# Patient Record
Sex: Female | Born: 1990 | Race: White | Hispanic: No | Marital: Married | Smoking: Current some day smoker
Health system: Southern US, Community
[De-identification: ages and names within clinical notes are randomized; demographics above are authoritative.]

## PROBLEM LIST (undated history)

## (undated) DIAGNOSIS — I498 Other specified cardiac arrhythmias: Secondary | ICD-10-CM

## (undated) DIAGNOSIS — A609 Anogenital herpesviral infection, unspecified: Secondary | ICD-10-CM

## (undated) DIAGNOSIS — I951 Orthostatic hypotension: Secondary | ICD-10-CM

## (undated) DIAGNOSIS — F419 Anxiety disorder, unspecified: Secondary | ICD-10-CM

## (undated) DIAGNOSIS — R000 Tachycardia, unspecified: Secondary | ICD-10-CM

## (undated) HISTORY — PX: RHINOPLASTY: SUR1284

## (undated) HISTORY — PX: BREAST ENHANCEMENT SURGERY: SHX7

## (undated) HISTORY — DX: Anogenital herpesviral infection, unspecified: A60.9

## (undated) HISTORY — PX: COSMETIC SURGERY: SHX468

---
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
## 2009-01-01 ENCOUNTER — Inpatient Hospital Stay (HOSPITAL_COMMUNITY): Admission: AD | Admit: 2009-01-01 | Discharge: 2009-01-01 | Payer: Self-pay | Admitting: Obstetrics & Gynecology

## 2009-04-25 ENCOUNTER — Emergency Department (HOSPITAL_COMMUNITY): Admission: EM | Admit: 2009-04-25 | Discharge: 2009-04-25 | Payer: Self-pay | Admitting: Emergency Medicine

## 2009-07-21 ENCOUNTER — Emergency Department (HOSPITAL_COMMUNITY): Admission: EM | Admit: 2009-07-21 | Discharge: 2009-07-21 | Payer: Self-pay | Admitting: Emergency Medicine

## 2009-09-26 ENCOUNTER — Ambulatory Visit: Payer: Self-pay | Admitting: Family Medicine

## 2009-12-08 ENCOUNTER — Inpatient Hospital Stay (HOSPITAL_COMMUNITY): Admission: AD | Admit: 2009-12-08 | Discharge: 2009-12-08 | Payer: Self-pay | Admitting: Obstetrics and Gynecology

## 2010-08-26 ENCOUNTER — Ambulatory Visit
Admission: RE | Admit: 2010-08-26 | Discharge: 2010-08-26 | Payer: Self-pay | Source: Home / Self Care | Attending: Internal Medicine | Admitting: Internal Medicine

## 2010-08-26 ENCOUNTER — Encounter: Payer: Self-pay | Admitting: Internal Medicine

## 2010-09-03 NOTE — Assessment & Plan Note (Signed)
Summary: f3y/ POTS-MB   Visit Type:  Follow-up Primary Provider:  Dr. Benedetto Goad, [facility]  CC:  fainting.  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  She is a 20 year old young lady who has had a drop out of school because of symptoms related to autonomic dysfunction. She is scheduled to enroll at [employer] and a fall.  She was seen 3 years ago with atypical story of POTS manifested by long-standing orthostatic and post exercise intolerance. she also found showers. And heat difficult. Salt and fluids were recommended. This helped for some period of time. More recently she has had more syncope. The first episode occurred after having spent 15 minutes in a Jacuzzi; another occurred after a couple of alcoholic beverages and more recently spell occurred sitting on the couch. She finds it showers persistently difficult more often in the morning and afternoon. Her diet she thinks is quite salt replete and her fluid intake through the day is quite generous although she wakes up with a very dark colored urine in the morning. She does not notice significant symptoms around the time of her period; she is on birth control with monthly periods.  She continues to note that postexercise lightheadedness is a problem. Walking in the mall to the problem. For her variable lighting situations can be provocative.  Because of post exercise lightheadedness and syncope she is given of exercise.  she denies use of recreational drugs. She does use alcohol occasionally.  She also notices that this has been very discombobulating to life with a secondary depression not surprisingly given the impact on her life. She is seeking counseling support for this.        Preventive Screening-Counseling & Management  Alcohol-Tobacco     Smoking Status: never  Caffeine-Diet-Exercise     Does Patient Exercise: no  Current  Medications (verified): 1)  Yaz 3-0.02 Mg Tabs (Drospirenone-Ethinyl Estradiol) .... Daily 2)  Alprazolam 1 Mg Tabs (Alprazolam) .Marland Kitchen.. 1 Tablet Twice A Day  Allergies (verified): 1)  ! Celexa  Past History:  Past Medical History: dysautonomia Anxiety Dysmenorrhea  Past Surgical History: none  Family History: Father:dysautonomia Mother:  Social History: Student  Full Time Single  Tobacco Use - No.  Alcohol Use - yes Regular Exercise - no Smoking Status:  never Does Patient Exercise:  no  Review of Systems       full review of systems was negative apart from a history of present illness and past medical history.   Vital Signs:  Patient profile:   20 year old female Weight:      105.75 pounds Pulse (ortho):   139 / minute BP standing:   130 / 90  Vitals Entered By: Micki Riley CNA (August 26, 2010 4:26 PM)  Serial Vital Signs/Assessments:  Time      Position  BP       Pulse  Resp  Temp     By 0 min     Lying LA  108/71   87                    Terin Shoffner CNA 0 min     Sitting   117/78   108                   Terin Shoffner CNA 0 min     Standing  130/90   139  Terin Shoffner CNA 2 min     Standing  118/88   124                   Terin Shoffner CNA 3 min     Standing  120/92   100                   Terin Shoffner CNA  Comments: 0 min pt  having  dizziness throughout bp  By: Micki Riley CNA  2 min dizziness By: Micki Riley CNA  3 min dizziness By: Micki Riley CNA    Physical Exam  General:  Alert and oriented Young Caucasian female appearing her stated age in no acute distress. HEENT  normal . Neck veins were flat; carotids brisk and full without bruits. No lymphadenopathy. Back without kyphosis. Lungs clear. Heart sounds regular without murmurs or gallops. PMI nondisplaced. Abdomen soft with active bowel sounds without midline pulsation or hepatomegaly. Femoral pulses and distal pulses intact. Extremities were without  clubbing cyanosis or edemaSkin warm and dry with multiple tatoos. Neurological exam grossly normal; affect somewhat anxious    EKG  Procedure date:  08/26/2010  Findings:      sinus rhythm at 85 Intervals 0.12 5.08/0.37 Axis is 91 Otherwise normal ECG  Impression & Recommendations:  Problem # 1:  DYSAUTONOMIA/POTS (ICD-742.8) Marissa Mitchell has dysautonomia manifesting primarily as pots. We spent a long time discussing the physiology and the interaction between stress and anxiety. She has undertaken salt repletion in the past. She is encouraged to continue her salt intake. She is further encouraged to increase her fluid intake in the form of salt-containing beverages .  we discussed situations which are particularly troublesome which are associated with vasodilatation and how to use isometrics to fend off symptoms.  We'll begin her on low-dose beta blockade. Will start her atenolol. She is advised of possible side effects. She will call in the event that there is a problem.  In addition she is going to undertake work with counseling on her anxiety. I applaud the maturity of this decision.  Further we have given her the website for POTS place an Ndrf which potentially will have clear resources that might be of advantage to her. We will also try and identify local people who might be glad to speak with her about that experience with POTS  The thing that I suggested that she tend to his symptom cycle around her period. There may be some for period  obliterating contraceptive therapy in the event that her symptoms do cycle with her periods  when it comes time to her reticulated to [facility], she will need notes from [place] for housing and other accommodations.  Problem # 2:  ANXIETY (ICD-300.00) as above  Patient Instructions: 1)  Your physician recommends that you schedule a follow-up appointment in: 10-12 weeks with dr Graciela Husbands 2)  Your physician has recommended you make the following change in your  medication: atenolol 25 mg 1 once daily  3)  You have been diagnosed with POTS or postural orthostatic tachycardia syndrome. POTS is defined by fast heart rate when going from a sitting or lying position to an upright posture. The fast heart rates usually occurs within ten minutes of rising. Please see the handout/brochure given to you today for more information. Prescriptions: ATENOLOL 25 MG TABS (ATENOLOL) 1 once daily  #30 x 6   Entered by:   Scherrie Bateman, LPN   Authorized by:   Nathen May,  MD, [facility]   Signed by:   Scherrie Bateman, LPN on 94/85/4627   Method used:   Electronically to        [id number] (retail)       [address]       [place], [place]  [address]       Ph: [phone]       Fax: [phone]   RxID:   [id number]

## 2010-10-13 LAB — GC/CHLAMYDIA PROBE AMP, GENITAL: GC Probe Amp, Genital: NEGATIVE

## 2010-10-13 LAB — URINALYSIS, ROUTINE W REFLEX MICROSCOPIC
Nitrite: NEGATIVE
Specific Gravity, Urine: <1.005 — ABNORMAL LOW (ref 1.005–1.030)
Urobilinogen, UA: 0.2 mg/dL (ref 0.0–1.0)
pH: 5.5 (ref 5.0–8.0)

## 2010-10-13 LAB — URINE MICROSCOPIC-ADD ON

## 2010-10-13 LAB — WET PREP, GENITAL
Clue Cells Wet Prep HPF POC: NONE SEEN
Trich, Wet Prep: NONE SEEN

## 2010-10-31 LAB — DIFFERENTIAL
Basophils Absolute: 0 10*3/uL (ref 0.0–0.1)
Basophils Relative: 0 % (ref 0–1)
Neutro Abs: 7.7 10*3/uL (ref 1.7–7.7)
Neutrophils Relative %: 67 % (ref 43–77)

## 2010-10-31 LAB — COMPREHENSIVE METABOLIC PANEL
Alkaline Phosphatase: 68 U/L (ref 39–117)
BUN: 8 mg/dL (ref 6–23)
Chloride: 103 mEq/L (ref 96–112)
Creatinine, Ser: 0.73 mg/dL (ref 0.4–1.2)
Glucose, Bld: 82 mg/dL (ref 70–99)
Potassium: 3.5 mEq/L (ref 3.5–5.1)
Total Bilirubin: 1 mg/dL (ref 0.3–1.2)

## 2010-10-31 LAB — URINALYSIS, ROUTINE W REFLEX MICROSCOPIC
Hgb urine dipstick: NEGATIVE
Nitrite: NEGATIVE
Protein, ur: NEGATIVE mg/dL
Specific Gravity, Urine: 1.034 — ABNORMAL HIGH (ref 1.005–1.030)
Urobilinogen, UA: 0.2 mg/dL (ref 0.0–1.0)

## 2010-10-31 LAB — URINE MICROSCOPIC-ADD ON

## 2010-10-31 LAB — CBC
HCT: 42.5 % (ref 36.0–46.0)
Hemoglobin: 14.3 g/dL (ref 12.0–15.0)
MCV: 94.6 fL (ref 78.0–100.0)
RDW: 14.5 % (ref 11.5–15.5)
WBC: 11.5 10*3/uL — ABNORMAL HIGH (ref 4.0–10.5)

## 2010-10-31 LAB — POCT CARDIAC MARKERS
CKMB, poc: <1 ng/mL — ABNORMAL LOW (ref 1.0–8.0)
Myoglobin, poc: 45.7 ng/mL (ref 12–200)

## 2010-10-31 LAB — POCT PREGNANCY, URINE: Preg Test, Ur: NEGATIVE

## 2010-11-03 LAB — URINALYSIS, ROUTINE W REFLEX MICROSCOPIC
Bilirubin Urine: NEGATIVE
Glucose, UA: NEGATIVE mg/dL
Hgb urine dipstick: NEGATIVE
Ketones, ur: NEGATIVE mg/dL
Protein, ur: NEGATIVE mg/dL

## 2010-11-03 LAB — HCG, SERUM, QUALITATIVE: Preg, Serum: NEGATIVE

## 2010-11-09 ENCOUNTER — Inpatient Hospital Stay (INDEPENDENT_AMBULATORY_CARE_PROVIDER_SITE_OTHER)
Admission: RE | Admit: 2010-11-09 | Discharge: 2010-11-09 | Disposition: A | Discharge status: Home / Self Care | Payer: Self-pay | Source: Ambulatory Visit | Attending: Family Medicine | Admitting: Family Medicine

## 2010-11-09 DIAGNOSIS — N390 Urinary tract infection, site not specified: Secondary | ICD-10-CM

## 2010-11-09 LAB — POCT URINALYSIS DIP (DEVICE)
Ketones, ur: NEGATIVE mg/dL
Protein, ur: 30 mg/dL — AB
Specific Gravity, Urine: 1.015 (ref 1.005–1.030)
pH: 6.5 (ref 5.0–8.0)

## 2010-11-09 LAB — POCT PREGNANCY, URINE: Preg Test, Ur: NEGATIVE

## 2010-11-09 LAB — WET PREP, GENITAL: Clue Cells Wet Prep HPF POC: NONE SEEN

## 2010-11-11 LAB — URINE CULTURE

## 2010-12-09 NOTE — Letter (Signed)
May 04, 2007    Marissa Beath, MD  [address]  [place], [place] [address]   RE:  Marissa, Mitchell  MRN:  [id number]  /  DOB:  Nov 25, 1990   Dear Marissa Mitchell:   I was a pleasure talking to you today about Marissa Mitchell, whose last  name I misspoke, which is why you were confused.   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  These were characterized by lightheadedness, flushing  diaphoresis  and some nausea.  Remotely, she recalls a comment being  made about extreme pallor.   Of late, she has had two syncope episodes.  The first occurred while she  was showering at the dormitory at school.  She was having a little bit  of a stomach discomfort.  She had her stereotypical prodrome.  She sat  down in the shower.  Symptoms abated.  She tried to get out of the  shower.  She had significant residual orthostatic intolerance.  She  became quite diaphoretic.  She got back to her bedroom with the side  effect that she could hardly see, laid down for 5-10 minutes and  symptoms abated.  The fatigue lasted about an hour.   The second episode also occurred at school.  She was using the commode  for constipation.  She developed her stereotypical prodrome.  At this  point, she was unable to make it back to her room.  She actually  collapsed on the floor of the hallway.  Her roommate heard her, helped  her back in.  There was no comments at that point as to her skin color.   As noted, she has had these spells before.  They have been typically  precipitated by being in hot rooms with many people, prolonged standing,  Jacuzzis and showers.  She also has a history of tachy palpitations  accompanying these episodes.  She notes that these palpitations  frequently occur with just changes in position.  These are described not  as abrupt in onset or offset but gradually and accompanying the  aforementioned episodes.   Her diet is quite fluid repleted but quit salt deplete.  Her periods are  controlled by her birth control pills.   Because of these episodes and the possible diagnosis of panic attack, it  was elected to put her on Fluoxetine at 10 mg.  This has not been  associated with any significant benefit.   PAST MEDICAL HISTORY:  Largely as above.  She does have some  constipation and diarrhea, fluctuates as she has irritable bowel.  There  has been a remote history of anxiety and depression, currently, fairly  quiescent.   Other than the strong family history of mitral valve prolapse, it is  not clear what this represents in any of them.   She does not use cigarettes, alcohol or recreational drugs.  She is  quite fit, and she does have significant post exercise lightheadedness.  I should note also she was a Biochemist, clinical in high school, again without  symptoms during the exercises, but the resting phase quite symptomatic.   PHYSICAL EXAMINATION:  She is a young Caucasian female appearing her  stated age of 76.  Her blood pressure was 112/72 without significant  change.  Her heart rate went from 80-96 with prolonged standing.  HEENT:  No icterus or xanthelasma.  NECK:  Veins were flat.  CAROTIDS:  Brisk and full without bruits.  BACK:  Without kyphosis, scoliosis.  LUNGS:  Clear.  HEART:  Sounds regular without murmurs or gallops.  ABDOMEN:  Soft with active bowel sounds without mass on pulsation or  hepatosplenomegaly.  EXTREMITIES:  Femoral pulses were 2+.  Distal pulses were intact.  There  was no cyanosis, clubbing or edema.  NEUROLOGICAL:  Grossly normal.  SKIN:  Warm and dry.   STUDIES:  Electrocardiogram dated today demonstrated sinus rhythm at 81  with intervals of 0.12, 0.08, 0.3.  The axis was rightward at 90  degrees.  It was otherwise normal.   IMPRESSION:  1. Syncope, likely neurocardiogenic.  2. Tachy palpitations and other symptoms consistent  with possible      orthostatic tachycardia syndrome.  3. Remote history of anxiety/depression.  4. Salt depleted diet.   Marissa Mitchell probably has a dysautonomia.  I gave her a booklet on  possible orthostatic tachycardia syndrome which includes a couple of  websites including NDRF.org and potsplace.com as an information source.   From a therapeutic interventional point of view, I reminded her that  salt intake was going to be key.  If she does not like to add salt, then  she should go ahead and get salt tablets.  She is also encouraged to be  very cognizant of her prodrome and to make herself supine when this  happens so as to prevent a fall.   I did not explore with her the dimensions of her anxiety and depression,  but clearly, this can aggravate the propensity towards neurally mediated  syndromes, and this will need to be kept in mind.   She asked if we would spend a little more time, so I scheduled this for  about three months.   If I could be of any further assistance Marissa Mitchell, please do not hesitate  to contact me.   Thanks very much for allowing me to participate in her care.    Sincerely,      Duke Salvia, MD, [facility]  Electronically Signed    SCK/MedQ  DD: 05/04/2007  DT: 05/05/2007  Job #: [id number]

## 2011-04-06 ENCOUNTER — Other Ambulatory Visit: Payer: Self-pay | Admitting: *Deleted

## 2011-04-06 MED ORDER — ATENOLOL 25 MG PO TABS: 25.0000 mg | ORAL_TABLET | Freq: Every day | ORAL | Status: DC

## 2011-04-21 LAB — BASIC METABOLIC PANEL
BUN: 4 — ABNORMAL LOW
Calcium: 9.1
GFR calc non Af Amer: >60
Glucose, Bld: 102 — ABNORMAL HIGH
Potassium: 3.3 — ABNORMAL LOW
Sodium: 139

## 2011-04-21 LAB — CBC
Platelets: 308
RBC: 4.06
WBC: 8.8

## 2011-04-21 LAB — RAPID URINE DRUG SCREEN, HOSP PERFORMED
Amphetamines: NOT DETECTED
Barbiturates: NOT DETECTED
Benzodiazepines: POSITIVE — AB
Cocaine: NOT DETECTED
Opiates: NOT DETECTED
Tetrahydrocannabinol: NOT DETECTED

## 2011-04-21 LAB — URINALYSIS, ROUTINE W REFLEX MICROSCOPIC
Bilirubin Urine: NEGATIVE
Ketones, ur: NEGATIVE
Nitrite: NEGATIVE
Urobilinogen, UA: 0.2
pH: 7

## 2011-04-21 LAB — DIFFERENTIAL
Lymphocytes Relative: 36
Lymphs Abs: 3.2
Monocytes Relative: 8
Neutro Abs: 4.7
Neutrophils Relative %: 54

## 2011-04-21 LAB — POCT I-STAT, CHEM 8
Creatinine, Ser: 1
Glucose, Bld: 87
HCT: 37
Hemoglobin: 12.6
Potassium: 3.6
Sodium: 139
TCO2: 26

## 2011-04-21 LAB — POCT CARDIAC MARKERS
CKMB, poc: <1 — ABNORMAL LOW
Myoglobin, poc: 91.4
Operator id: 294511

## 2011-04-21 LAB — PREGNANCY, URINE: Preg Test, Ur: NEGATIVE

## 2011-09-06 ENCOUNTER — Emergency Department: Payer: Self-pay | Admitting: Emergency Medicine

## 2011-09-06 LAB — CBC WITH DIFFERENTIAL/PLATELET
Basophil #: 0 10*3/uL (ref 0.0–0.1)
Basophil %: 0.5 %
Eosinophil #: 0.1 10*3/uL (ref 0.0–0.7)
Eosinophil %: 1.5 %
HCT: 36.5 % (ref 35.0–47.0)
HGB: 12.5 g/dL (ref 12.0–16.0)
Lymphocyte #: 2.3 10*3/uL (ref 1.0–3.6)
Lymphocyte %: 28 %
MCH: 31.6 pg (ref 26.0–34.0)
MCHC: 34.1 g/dL (ref 32.0–36.0)
MCV: 93 fL (ref 80–100)
Monocyte #: 0.7 10*3/uL (ref 0.0–0.7)
Monocyte %: 8.6 %
Neutrophil #: 5.1 10*3/uL (ref 1.4–6.5)
Neutrophil %: 61.4 %
Platelet: 207 10*3/uL (ref 150–440)
RBC: 3.95 10*6/uL (ref 3.80–5.20)
RDW: 13.1 % (ref 11.5–14.5)
WBC: 8.3 10*3/uL (ref 3.6–11.0)

## 2011-09-06 LAB — PREGNANCY, URINE: Pregnancy Test, Urine: NEGATIVE m[IU]/mL

## 2011-11-19 ENCOUNTER — Emergency Department: Payer: Self-pay | Admitting: Emergency Medicine

## 2011-11-19 LAB — URINALYSIS, COMPLETE
Blood: NEGATIVE
Glucose,UR: NEGATIVE mg/dL (ref 0–75)
Ketone: NEGATIVE
Leukocyte Esterase: NEGATIVE
Specific Gravity: 1.005 (ref 1.003–1.030)
Squamous Epithelial: 5

## 2011-11-19 LAB — CBC
HGB: 12.4 g/dL (ref 12.0–16.0)
MCH: 30.9 pg (ref 26.0–34.0)
MCHC: 33.5 g/dL (ref 32.0–36.0)
MCV: 92 fL (ref 80–100)
RDW: 13.6 % (ref 11.5–14.5)
WBC: 15.3 10*3/uL — ABNORMAL HIGH (ref 3.6–11.0)

## 2011-11-19 LAB — COMPREHENSIVE METABOLIC PANEL
Albumin: 3.8 g/dL (ref 3.4–5.0)
Alkaline Phosphatase: 62 U/L (ref 50–136)
BUN: 6 mg/dL — ABNORMAL LOW (ref 7–18)
Bilirubin,Total: 0.4 mg/dL (ref 0.2–1.0)
Chloride: 104 mmol/L (ref 98–107)
Creatinine: 0.57 mg/dL — ABNORMAL LOW (ref 0.60–1.30)
EGFR (African American): >60
EGFR (Non-African Amer.): >60
Osmolality: 270 (ref 275–301)
Potassium: 3.5 mmol/L (ref 3.5–5.1)
Sodium: 136 mmol/L (ref 136–145)

## 2011-11-19 LAB — MONONUCLEOSIS SCREEN: Mono Test: NEGATIVE

## 2011-11-25 LAB — CULTURE, BLOOD (SINGLE)

## 2012-11-10 ENCOUNTER — Emergency Department: Payer: Self-pay | Admitting: Emergency Medicine

## 2012-11-12 ENCOUNTER — Emergency Department (HOSPITAL_COMMUNITY)
Admission: EM | Admit: 2012-11-12 | Discharge: 2012-11-12 | Disposition: A | Discharge status: Home / Self Care | Payer: Self-pay | Attending: Emergency Medicine | Admitting: Emergency Medicine

## 2012-11-12 ENCOUNTER — Encounter (HOSPITAL_COMMUNITY): Payer: Self-pay | Admitting: Emergency Medicine

## 2012-11-12 DIAGNOSIS — H53149 Visual discomfort, unspecified: Secondary | ICD-10-CM | POA: Insufficient documentation

## 2012-11-12 DIAGNOSIS — R51 Headache: Secondary | ICD-10-CM | POA: Insufficient documentation

## 2012-11-12 DIAGNOSIS — Z79899 Other long term (current) drug therapy: Secondary | ICD-10-CM | POA: Insufficient documentation

## 2012-11-12 DIAGNOSIS — H538 Other visual disturbances: Secondary | ICD-10-CM | POA: Insufficient documentation

## 2012-11-12 DIAGNOSIS — F17200 Nicotine dependence, unspecified, uncomplicated: Secondary | ICD-10-CM | POA: Insufficient documentation

## 2012-11-12 DIAGNOSIS — H109 Unspecified conjunctivitis: Secondary | ICD-10-CM | POA: Insufficient documentation

## 2012-11-12 MED ORDER — FLUORESCEIN SODIUM 1 MG OP STRP
1.0000 | ORAL_STRIP | Freq: Once | OPHTHALMIC | Status: DC
  Filled 2012-11-12: qty 1

## 2012-11-12 MED ORDER — TETRACAINE HCL 0.5 % OP SOLN
1.0000 [drp] | Freq: Once | OPHTHALMIC | Status: DC
  Filled 2012-11-12: qty 2

## 2012-11-12 NOTE — ED Notes (Signed)
Diagnosed with pink eye (left eye) on Wednesday at [facility].  Using eye drops 4 times per day.  Reports blurred vision to L eye since 11pm.

## 2012-11-12 NOTE — ED Provider Notes (Signed)
History     CSN: [id number]  Arrival date & time 11/12/12  4098   First MD Initiated Contact with Patient 11/12/12 [phone]      Chief Complaint  Patient presents with  . Conjunctivitis    (Consider location/radiation/quality/duration/timing/severity/associated sxs/prior treatment) HPI  Patient is a 22 yo F recently diagnosed at [facility] ED with bilateral bacterial conjunctivitis now presenting w/ L eye blurred vision that began this morning at midnight after antibiotic drop use. Patient states both eyes have been painful since she was diagnosed with conjunctivitis, but left eye is worse with eye drop use. States it feels like sharp glass in her eyes. Patient uses glasses for vision, but does not always wear them. Does not wear contact lenses. Denies any new trauma to eyes since work up at [facility] ED. Denies fevers, chills, headache, orbital erythema, orbital swelling, vision loss, or other visual complaints.    History reviewed. No pertinent past medical history.  History reviewed. No pertinent past surgical history.  No family history on file.  History  Substance Use Topics  . Smoking status: Current Every Day Smoker  . Smokeless tobacco: Not on file  . Alcohol Use: Yes    OB History   Grav Para Term Preterm Abortions TAB SAB Ect Mult Living                  Review of Systems  Constitutional: Negative.   Eyes: Positive for photophobia, pain, discharge, redness, itching and visual disturbance.  Respiratory: Negative.   Cardiovascular: Negative.   Gastrointestinal: Negative.   Genitourinary: Negative.   Musculoskeletal: Negative.   Neurological: Positive for headaches.    Allergies  Citalopram hydrobromide and Percocet  Home Medications   Current Outpatient Rx  Name  Route  Sig  Dispense  Refill  . ALPRAZolam (XANAX) 1 MG tablet   Oral   Take 1 mg by mouth 3 (three) times daily as needed for anxiety.         Marland Kitchen atenolol (TENORMIN) 25 MG tablet   Oral  Take 1 tablet (25 mg total) by mouth daily.   30 tablet   3   . ibuprofen (ADVIL,MOTRIN) 200 MG tablet   Oral   Take 600 mg by mouth every 6 (six) hours as needed for pain.         Marland Kitchen trimethoprim-polymyxin b (POLYTRIM) ophthalmic solution   Both Eyes   Place 2 drops into both eyes every 6 (six) hours.           BP 131/85  Pulse 84  Temp(Src) 98.2 F (36.8 C) (Oral)  Resp 18  SpO2 100%  Physical Exam  Constitutional: She is oriented to person, place, and time. She appears well-developed and well-nourished.  HENT:  Head: Normocephalic and atraumatic.  Eyes: EOM are normal. Pupils are equal, round, and reactive to light. No foreign bodies found. Right eye exhibits discharge. Right eye exhibits no exudate and no hordeolum. No foreign body present in the right eye. Left eye exhibits discharge. Left eye exhibits no exudate and no hordeolum. No foreign body present in the left eye. Right conjunctiva is injected. Left conjunctiva is injected.  No periorbital swelling bilaterally  Neck: Normal range of motion. Neck supple.  Cardiovascular: Normal rate, regular rhythm and normal heart sounds.   Pulmonary/Chest: Effort normal and breath sounds normal.  Abdominal: Soft. Bowel sounds are normal.  Neurological: She is alert and oriented to person, place, and time.  Skin: Skin is warm and dry.  Psychiatric: She has a normal mood and affect.     ED Course  Procedures (including critical care time)  OD: 20/40 OS: 20/50   Fluorescein stain performed left eye - no signs of corneal abrasions or ulcers noted.    Labs Reviewed - No data to display No results found.   1. Conjunctivitis       MDM  Patient presentation consistent with bacterial conjunctivitis.  With minimal purulent discharge. No corneal abrasions, entrapment, consensual photophobia, or dendritic staining with fluorescein study.  Presentation non-concerning for iritis, bacterial conjunctivitis, corneal abrasions, or  HSV.  No antibiotics are indicated and patient will be prescribed naphazoline for itching.  Personal hygiene and frequent handwashing discussed.  Patient advised to followup with ophthalmologist if symptoms persist or worsen in any way including vision change or purulent discharge.  Patient verbalizes understanding and is agreeable with discharge. Patient d/w with Dr. Oletta Lamas, agrees with plan. Patient is stable at time of discharge.           Jeannetta Ellis, PA-C 11/12/12 1502

## 2012-11-12 NOTE — ED Notes (Signed)
Left eye- 20/50 right eye- 20/40. Pt states she has glasses but does not wear them.

## 2012-11-13 NOTE — ED Provider Notes (Signed)
Medical screening examination/treatment/procedure(s) were performed by non-physician practitioner and as supervising physician I was immediately available for consultation/collaboration.   Gaelle Adriance Y. Alano Blasco, MD 11/13/12 0028 

## 2013-09-21 ENCOUNTER — Encounter (HOSPITAL_COMMUNITY): Payer: Self-pay | Admitting: Emergency Medicine

## 2013-09-21 ENCOUNTER — Emergency Department (HOSPITAL_COMMUNITY)
Admission: EM | Admit: 2013-09-21 | Discharge: 2013-09-21 | Disposition: A | Discharge status: Home / Self Care | Payer: Self-pay | Attending: Emergency Medicine | Admitting: Emergency Medicine

## 2013-09-21 ENCOUNTER — Emergency Department (HOSPITAL_COMMUNITY): Payer: Self-pay

## 2013-09-21 DIAGNOSIS — S61209A Unspecified open wound of unspecified finger without damage to nail, initial encounter: Secondary | ICD-10-CM | POA: Insufficient documentation

## 2013-09-21 DIAGNOSIS — F17200 Nicotine dependence, unspecified, uncomplicated: Secondary | ICD-10-CM | POA: Insufficient documentation

## 2013-09-21 DIAGNOSIS — Z79899 Other long term (current) drug therapy: Secondary | ICD-10-CM | POA: Insufficient documentation

## 2013-09-21 DIAGNOSIS — Y929 Unspecified place or not applicable: Secondary | ICD-10-CM | POA: Insufficient documentation

## 2013-09-21 DIAGNOSIS — Y939 Activity, unspecified: Secondary | ICD-10-CM | POA: Insufficient documentation

## 2013-09-21 DIAGNOSIS — W540XXA Bitten by dog, initial encounter: Secondary | ICD-10-CM | POA: Insufficient documentation

## 2013-09-21 HISTORY — DX: Tachycardia, unspecified: R00.0

## 2013-09-21 HISTORY — DX: Other specified cardiac arrhythmias: I49.8

## 2013-09-21 HISTORY — DX: Orthostatic hypotension: I95.1

## 2013-09-21 MED ORDER — HYDROCODONE-ACETAMINOPHEN 5-325 MG PO TABS: 2.0000 | ORAL_TABLET | ORAL | Status: DC | PRN

## 2013-09-21 MED ORDER — AMOXICILLIN-POT CLAVULANATE 875-125 MG PO TABS
1.0000 | ORAL_TABLET | Freq: Once | ORAL | Status: AC
  Administered 2013-09-21: 1 via ORAL
  Filled 2013-09-21: qty 1

## 2013-09-21 MED ORDER — AMOXICILLIN-POT CLAVULANATE 875-125 MG PO TABS: 1.0000 | ORAL_TABLET | Freq: Two times a day (BID) | ORAL | Status: DC

## 2013-09-21 NOTE — ED Notes (Signed)
Pt st's she was bitten on left hand by neighbors dog.  Pt has sm puncture wound to left thumb.  [place] notified her that the dog is up to date on its shots.

## 2013-09-21 NOTE — Discharge Instructions (Signed)
Animal Bite °An animal bite can result in a scratch on the skin, deep open cut, puncture of the skin, crush injury, or tearing away of the skin or a body part. Dogs are responsible for most animal bites. Children are bitten more often than adults. An animal bite can range from very mild to more serious. A small bite from your house pet is no cause for alarm. However, some animal bites can become infected or injure a bone or other tissue. You must seek medical care if: °· The skin is broken and bleeding does not slow down or stop after 15 minutes. °· The puncture is deep and difficult to clean (such as a cat bite). °· Pain, warmth, redness, or pus develops around the wound. °· The bite is from a stray animal or rodent. There may be a risk of rabies infection. °· The bite is from a snake, raccoon, skunk, fox, coyote, or bat. There may be a risk of rabies infection. °· The person bitten has a chronic illness such as diabetes, liver disease, or cancer, or the person takes medicine that lowers the immune system. °· There is concern about the location and severity of the bite. °It is important to clean and protect an animal bite wound right away to prevent infection. Follow these steps: °· Clean the wound with plenty of water and soap. °· Apply an antibiotic cream. °· Apply gentle pressure over the wound with a clean towel or gauze to slow or stop bleeding. °· Elevate the affected area above the heart to help stop any bleeding. °· Seek medical care. Getting medical care within 8 hours of the animal bite leads to the best possible outcome. °DIAGNOSIS  °Your caregiver will most likely: °· Take a detailed history of the animal and the bite injury. °· Perform a wound exam. °· Take your medical history. °Blood tests or X-rays may be performed. Sometimes, infected bite wounds are cultured and sent to a lab to identify the infectious bacteria.  °TREATMENT  °Medical treatment will depend on the location and type of animal bite as  well as the patient's medical history. Treatment may include: °· Wound care, such as cleaning and flushing the wound with saline solution, bandaging, and elevating the affected area. °· Antibiotics. °· Tetanus immunization. °· Rabies immunization. °· Leaving the wound open to heal. This is often done with animal bites, due to the high risk of infection. However, in certain cases, wound closure with stitches, wound adhesive, skin adhesive strips, or staples may be used. ° Infected bites that are left untreated may require intravenous (IV) antibiotics and surgical treatment in the hospital. °HOME CARE INSTRUCTIONS °· Follow your caregiver's instructions for wound care. °· Take all medicines as directed. °· If your caregiver prescribes antibiotics, take them as directed. Finish them even if you start to feel better. °· Follow up with your caregiver for further exams or immunizations as directed. °You may need a tetanus shot if: °· You cannot remember when you had your last tetanus shot. °· You have never had a tetanus shot. °· The injury broke your skin. °If you get a tetanus shot, your arm may swell, get red, and feel warm to the touch. This is common and not a problem. If you need a tetanus shot and you choose not to have one, there is a rare chance of getting tetanus. Sickness from tetanus can be serious. °SEEK MEDICAL CARE IF: °· You notice warmth, redness, soreness, swelling, pus discharge, or a bad   smell coming from the wound.  You have a red line on the skin coming from the wound.  You have a fever, chills, or a general ill feeling.  You have nausea or vomiting.  You have continued or worsening pain.  You have trouble moving the injured part.  You have other questions or concerns. MAKE SURE YOU:  Understand these instructions.  Will watch your condition.  Will get help right away if you are not doing well or get worse. Document Released: 03/31/2011 Document Revised: 10/05/2011 Document  Reviewed: 03/31/2011 [facility] Patient Information 2014 [place], [place].  Puncture Wound A puncture wound is an injury that extends through all layers of the skin and into the tissue beneath the skin (subcutaneous tissue). Puncture wounds become infected easily because germs often enter the body and go beneath the skin during the injury. Having a deep wound with a small entrance point makes it difficult for your caregiver to adequately clean the wound. This is especially true if you have stepped on a nail and it has passed through a dirty shoe or other situations where the wound is obviously contaminated. CAUSES  Many puncture wounds involve glass, nails, splinters, fish hooks, or other objects that enter the skin (foreign bodies). A puncture wound may also be caused by a human bite or animal bite. DIAGNOSIS  A puncture wound is usually diagnosed by your history and a physical exam. You may need to have an X-ray or an ultrasound to check for any foreign bodies still in the wound. TREATMENT   Your caregiver will clean the wound as thoroughly as possible. Depending on the location of the wound, a bandage (dressing) may be applied.  Your caregiver might prescribe antibiotic medicines.  You may need a follow-up visit to check on your wound. Follow all instructions as directed by your caregiver. HOME CARE INSTRUCTIONS   Change your dressing once per day, or as directed by your caregiver. If the dressing sticks, it may be removed by soaking the area in water.  If your caregiver has given you follow-up instructions, it is very important that you return for a follow-up appointment. Not following up as directed could result in a chronic or permanent injury, pain, and disability.  Only take over-the-counter or prescription medicines for pain, discomfort, or fever as directed by your caregiver.  If you are given antibiotics, take them as directed. Finish them even if you start to feel better. You may  need a tetanus shot if:  You cannot remember when you had your last tetanus shot.  You have never had a tetanus shot. If you got a tetanus shot, your arm may swell, get red, and feel warm to the touch. This is common and not a problem. If you need a tetanus shot and you choose not to have one, there is a rare chance of getting tetanus. Sickness from tetanus can be serious. You may need a rabies shot if an animal bite caused your puncture wound. SEEK MEDICAL CARE IF:   You have redness, swelling, or increasing pain in the wound.  You have red streaks going away from the wound.  You notice a bad smell coming from the wound or dressing.  You have yellowish-white fluid (pus) coming from the wound.  You are treated with an antibiotic for infection, but the infection is not getting better.  You notice something in the wound, such as rubber from your shoe, cloth, or another object.  You have a fever.  You have  severe pain.  You have difficulty breathing.  You feel dizzy or faint.  You cannot stop vomiting.  You lose feeling, develop numbness, or cannot move a limb below the wound.  Your symptoms worsen. MAKE SURE YOU:  Understand these instructions.  Will watch your condition.  Will get help right away if you are not doing well or get worse. Document Released: 04/22/2005 Document Revised: 10/05/2011 Document Reviewed: 12/30/2010 [facility] Patient Information 2014 [place], [place].

## 2013-09-21 NOTE — ED Notes (Addendum)
Dressing with bacitracin applied to affected area (Lt Thumb)

## 2013-09-21 NOTE — ED Provider Notes (Signed)
CSN: [id number]     Arrival date & time 09/21/13  2009 History  This chart was scribed for non-physician practitioner Cherrie DistanceFrances Demarrio Menges, PA-C working with Shanna CiscoMegan E Docherty, MD by Danella Maiersaroline Early, ED Scribe. This patient was seen in room TR09C/TR09C and the patient's care was started at 8:20 PM.    Chief Complaint  Patient presents with  . Animal Bite   The history is provided by the patient. No language interpreter was used.   HPI Comments: Marissa HuntsmanHannah B Mitchell is a 23 y.o. female who presents to the Emergency Department complaining of a dog bite to the left hand by a neighbor's small dog that occurred tonight. Pt reports a small puncture wound to the left thumb. Pt states that according to the police the dog is up to date on its shots. Her last tetanus was 2-3 years ago.   Past Medical History  Diagnosis Date  . POTS (postural orthostatic tachycardia syndrome)    Past Surgical History  Procedure Laterality Date  . Breast enhancement surgery    . Cosmetic surgery     No family history on file. History  Substance Use Topics  . Smoking status: Current Every Day Smoker  . Smokeless tobacco: Not on file  . Alcohol Use: Yes   OB History   Grav Para Term Preterm Abortions TAB SAB Ect Mult Living                 Review of Systems  Skin: Positive for wound.  Neurological: Negative for numbness.  All other systems reviewed and are negative.      Allergies  Citalopram hydrobromide and Percocet  Home Medications   Current Outpatient Rx  Name  Route  Sig  Dispense  Refill  . ALPRAZolam (XANAX) 1 MG tablet   Oral   Take 1 mg by mouth 3 (three) times daily as needed for anxiety.         Marland Kitchen. atenolol (TENORMIN) 25 MG tablet   Oral   Take 1 tablet (25 mg total) by mouth daily.   30 tablet   3   . ibuprofen (ADVIL,MOTRIN) 200 MG tablet   Oral   Take 600 mg by mouth every 6 (six) hours as needed for pain.         Marland Kitchen. trimethoprim-polymyxin b (POLYTRIM) ophthalmic solution   Both  Eyes   Place 2 drops into both eyes every 6 (six) hours.          BP 111/62  Pulse 81  Temp(Src) 98.3 F (36.8 C) (Oral)  Resp 16  Ht 5\' 5"  (1.651 m)  Wt 107 lb (48.535 kg)  BMI 17.81 kg/m2  SpO2 97%  LMP 07/28/2013 Physical Exam  Nursing note and vitals reviewed. Constitutional: She is oriented to person, place, and time. She appears well-developed and well-nourished. No distress.  HENT:  Head: Normocephalic and atraumatic.  Mouth/Throat: Oropharynx is clear and moist.  Eyes: Conjunctivae are normal. No scleral icterus.  Pulmonary/Chest: Effort normal.  Musculoskeletal: Normal range of motion. She exhibits tenderness. She exhibits no edema.       Left hand: She exhibits tenderness. She exhibits normal range of motion, no bony tenderness, normal two-point discrimination, normal capillary refill and no deformity. Decreased sensation noted. Decreased sensation is present in the radial distribution. Decreased sensation is not present in the ulnar distribution and is not present in the medial redistribution. Normal strength noted. She exhibits no finger abduction, no thumb/finger opposition and no wrist extension trouble.  Hands: Neurological: She is alert and oriented to person, place, and time. She exhibits normal muscle tone. Coordination normal.  Skin: Skin is warm and dry. No rash noted. No erythema. No pallor.  Psychiatric: She has a normal mood and affect. Her behavior is normal. Judgment and thought content normal.    ED Course  Procedures (including critical care time) Medications - No data to display  DIAGNOSTIC STUDIES: Oxygen Saturation is 97% on RA, normal by my interpretation.    COORDINATION OF CARE: 8:25 PM- Discussed treatment plan with pt which includes antibiotics. Pt agrees to plan.    Labs Review Labs Reviewed - No data to display Imaging Review No results found.  EKG Interpretation   None       Dg Hand Complete Left  09/21/2013   CLINICAL  DATA:  Dog bite.  EXAM: LEFT HAND - COMPLETE 3+ VIEW  COMPARISON:  None.  FINDINGS: Imaged bones, joints and soft tissues appear normal.  IMPRESSION: Negative exam.   Electronically Signed   By: Drusilla Kanner M.D.   On: 09/21/2013 21:28     MDM   Dog bite to left thumb  Patient with dog bite to left thumb, no evidence of tendon damage, numbness noted proximal to injury but none distal, no erythema at this point and wound is not suturable.  Plan to place her on augmentin and have her return to an ED in [place] on Saturday.  She will follow up with Dr. [name] if needed next week.  I spoke with Dr. [name] on the phone in regards to this patient who recommends follow up in 2 days to ensure no ensuing infection, but does not appear to have a surgical injury that may require repair.  I personally performed the services described in this documentation, which was scribed in my presence. The recorded information has been reviewed and is accurate.    Marissa Mitchell Marisue Humble, [place] 09/21/13 [id number]

## 2013-09-22 NOTE — ED Provider Notes (Signed)
Medical screening examination/treatment/procedure(s) were performed by non-physician practitioner and as supervising physician I was immediately available for consultation/collaboration.   Kimara Bencomo E Jadan Hinojos, MD 09/22/13 1304 

## 2014-09-03 ENCOUNTER — Encounter: Payer: Self-pay | Admitting: Internal Medicine

## 2014-09-03 ENCOUNTER — Ambulatory Visit (INDEPENDENT_AMBULATORY_CARE_PROVIDER_SITE_OTHER): Payer: No Typology Code available for payment source | Admitting: Internal Medicine

## 2014-09-03 VITALS — BP 116/74 | HR 83 | Ht 64.0 in | Wt 111.4 lb

## 2014-09-03 DIAGNOSIS — R000 Tachycardia, unspecified: Secondary | ICD-10-CM

## 2014-09-03 DIAGNOSIS — G90A Postural orthostatic tachycardia syndrome (POTS): Secondary | ICD-10-CM

## 2014-09-03 DIAGNOSIS — I951 Orthostatic hypotension: Secondary | ICD-10-CM

## 2014-09-03 NOTE — Progress Notes (Signed)
ELECTROPHYSIOLOGY CONSULT NOTE  Patient ID: Marissa Mitchell, MRN: [id number], DOB/AGE: [birth year] 23 y.o. Admit date: (Not on file) Date of Consult: 09/03/2014  Primary Physician: Pamelia HoitWILSON,FRED HENRY, MD Primary Cardiologist: new Chief Complaint: POTS   HPI Marissa Mitchell is a 24 y.o. female  Whom I last saw in 2012 and last before then in 2009. She has a history of long-standing orthostatic intolerance and exercise intolerance. She's had heat intolerance and shower intolerance. She actually struggled with seen in school because of symptoms. 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 This continues to be some issue. She has been on Xanax for this for some time. Previously she was on SSRI which she didn't tolerate well.    It was her impression that atenolol , while initially prescribed was associated with an continue to be associated with fatigue, was markedly improving her symptoms of orthostatic intolerance. She has worked hard on   volume repletion , perhaps not as aggressive about salt repletion  Symptoms now include exercise intolerance shower intolerance orthostatic intolerance heat intolerance. Her symptoms have been worse on her birth control which allows. As opposed to those that were unassociated with periods.       Past Medical History  Diagnosis Date  . POTS (postural orthostatic tachycardia syndrome)       Surgical History:  Past Surgical History  Procedure Laterality Date  . Breast enhancement surgery    . Cosmetic surgery       Home Meds: Prior to Admission medications   Medication Sig Start Date End Date Taking? Authorizing Provider  ALPRAZolam Prudy Feeler(XANAX) 1 MG tablet Take 1 mg by mouth 3 (three) times daily as needed for anxiety.   Yes Historical Provider, MD  atenolol (TENORMIN) 25 MG tablet Take 25 mg by mouth daily.   Yes Historical Provider, MD  drospirenone-ethinyl estradiol (YAZ,GIANVI,LORYNA) 3-0.02 MG tablet Take 1 tablet  by mouth daily.   Yes Historical Provider, MD  promethazine (PHENERGAN) 25 MG tablet Take 25 mg by mouth every 6 (six) hours as needed for nausea or vomiting.   Yes Historical Provider, MD     Allergies:  Allergies  Allergen Reactions  . Citalopram Hydrobromide Hives  . Percocet [Oxycodone-Acetaminophen] Hives    History   Social History  . Marital Status: Single    Spouse Name: N/A    Number of Children: N/A  . Years of Education: N/A   Occupational History  . Not on file.   Social History Main Topics  . Smoking status: Current Every Day Smoker  . Smokeless tobacco: Not on file  . Alcohol Use: Yes  . Drug Use: No  . Sexual Activity: Not on file   Other Topics Concern  . Not on file   Social History Narrative     History reviewed. No pertinent family history.   ROS:  Please see the history of present illness.     All other systems reviewed and negative.    Physical Exam: Blood pressure 116/74, pulse 83, height 5\' 4"  (1.626 m), weight 111 lb 6.4 oz (50.531 kg). General: Well developed, well nourished female in no acute distress. Head: Normocephalic, atraumatic, sclera non-icteric, no xanthomas, nares are without discharge. EENT: normal Lymph Nodes:  none Back: without scoliosis/kyphosis, no CVA tendersness Neck: Negative for carotid bruits. JVD not elevated. Lungs: Clear bilaterally to auscultation without wheezes, rales, or rhonchi. Breathing is unlabored. Heart: RRR with S1 S2. No murmur , rubs, or gallops appreciated.  Abdomen: Soft, non-tender, non-distended with normoactive bowel sounds. No hepatomegaly. No rebound/guarding. No obvious abdominal masses. Msk:  Strength and tone appear normal for age. Extremities: No clubbing or cyanosis. No  edema.  Distal pedal pulses are 2+ and equal bilaterally. Skin: Warm and Dry; multiple tatoos Neuro: Alert and oriented X 3. CN III-XII intact Grossly normal sensory and motor function . Psych:  Responds to questions  appropriately with a normal affect.      Labs: Cardiac Enzymes No results for input(s): CKTOTAL, CKMB, TROPONINI in the last 72 hours. CBC Lab Results  Component Value Date   WBC 11.5* 04/25/2009   HGB 14.3 04/25/2009   HCT 42.5 04/25/2009   MCV 94.6 04/25/2009   PLT 258 04/25/2009   No results found.  EKG: * sinus 13/08/39 Ow normal   Assessment and Plan:  Dysautonomia consistent with POTS  Elevated blood pressure  The patient has symptoms associated with POTS with orthostatic intolerance exercise intolerance heat intolerance as well as complaints of leg pain.  We reviewed the physiology of autonomic regulation and have suggested that she look at the information from the [place]OT[place] and NDRF websites.  We will work on salt repletion and exercise. We discussed the importance of recumbent exercise and the intention that needs to be paid to be coming vertical following exercise.  We also discussed the potential value of an abdominal binder/girdle.  Other practical things discussed included 1-evaluate handicap sticker, 2-showers at night, 3-elevation of the head of the bed.  We also discussed that typically pregnancy is well tolerated except with hyperemeis and the late third trimester  I suggested that she follow-up with her PCP regarding psychotropic medications. Specifically advised she get herself off of Xanax and to consider SSRI therapy has an anxiolytic component to it.  Sherryl Manges We spent more than 50% of our >75 min visit in face to face counseling regarding the above

## 2014-09-03 NOTE — Patient Instructions (Addendum)
Your physician recommends that you continue on your current medications as directed. Please refer to the Current Medication list given to you today.  You have been given 3 different prescriptions to try. You may take them in any order. DO NOT take these medications at the same time.  Metoprolol Tartrate 50 mg twice daily Metoprolol Succinate 50 mg daily Inderal 60 daily  Your physician recommends that you schedule a follow-up appointment in: 10-12 weeks with Dr. Graciela HusbandsKlein.

## 2014-10-08 ENCOUNTER — Other Ambulatory Visit: Payer: Self-pay | Admitting: *Deleted

## 2014-10-08 MED ORDER — METOPROLOL SUCCINATE ER 50 MG PO TB24: 50.0000 mg | ORAL_TABLET | Freq: Every day | ORAL | Status: DC

## 2014-10-08 NOTE — Telephone Encounter (Signed)
Patient called and stated that she had found that the metoprolol succinate worked well for her and would like an rx sent in.

## 2014-12-01 ENCOUNTER — Other Ambulatory Visit: Payer: Self-pay | Admitting: Internal Medicine

## 2015-02-20 ENCOUNTER — Encounter: Payer: Self-pay | Admitting: Internal Medicine

## 2015-02-26 ENCOUNTER — Encounter: Payer: Self-pay | Admitting: Internal Medicine

## 2015-03-01 ENCOUNTER — Encounter: Payer: Self-pay | Admitting: Internal Medicine

## 2015-03-01 ENCOUNTER — Ambulatory Visit (INDEPENDENT_AMBULATORY_CARE_PROVIDER_SITE_OTHER): Payer: No Typology Code available for payment source | Admitting: Internal Medicine

## 2015-03-01 VITALS — BP 120/76 | HR 106 | Ht 65.0 in | Wt 107.6 lb

## 2015-03-01 DIAGNOSIS — R000 Tachycardia, unspecified: Secondary | ICD-10-CM | POA: Diagnosis not present

## 2015-03-01 DIAGNOSIS — I951 Orthostatic hypotension: Secondary | ICD-10-CM

## 2015-03-01 DIAGNOSIS — G90A Postural orthostatic tachycardia syndrome (POTS): Secondary | ICD-10-CM

## 2015-03-01 NOTE — Addendum Note (Signed)
Addended by: Baird Lyons on: 03/01/2015 04:36 PM   Modules accepted: Level of Service

## 2015-03-01 NOTE — Patient Instructions (Signed)
Medication Instructions:  Your physician recommends that you continue on your current medications as directed. Please refer to the Current Medication list given to you today.  Labwork: None ordered  Testing/Procedures: None ordered  Follow-Up: Your physician wants you to follow-up in: 1 year with Dr. Klein.  You will receive a reminder letter in the mail two months in advance. If you don't receive a letter, please call our office to schedule the follow-up appointment.  Any Other Special Instructions Will Be Listed Below (If Applicable). Thank you for choosing [facility]!!        

## 2015-03-01 NOTE — Progress Notes (Signed)
ELECTROPHYSIOLOGY progress  NOTE  Patient ID: Marissa Mitchell, MRN: [id number], DOB/AGE: [birth year] 26 y.o. Admit date: (Not on file) Date of Consult: 03/01/2015  Primary Physician: Pamelia Hoit, MD Primary Cardiologist: new Chief Complaint: POTS   HPI Marissa Mitchell is a 24 y.o. female    Seen in follow-up for symptoms of dysautonomia/POTS in the context of elevated blood pressure. She is on low-dose beta blockers. She is not exercising. She is working hard on fluid repletion with water but not with sugar containing fluids. She adds salt to her food but does not use sodium supplementation. She is remains intolerant of heat but overall life is okay.    Past Medical History  Diagnosis Date  . POTS (postural orthostatic tachycardia syndrome)       Surgical History:  Past Surgical History  Procedure Laterality Date  . Breast enhancement surgery    . Cosmetic surgery       Home Meds: Prior to Admission medications   Medication Sig Start Date End Date Taking? Authorizing Provider  ALPRAZolam Prudy Feeler) 1 MG tablet Take 1 mg by mouth 3 (three) times daily as needed for anxiety.   Yes Historical Provider, MD  atenolol (TENORMIN) 25 MG tablet Take 25 mg by mouth daily.   Yes Historical Provider, MD  drospirenone-ethinyl estradiol (YAZ,GIANVI,LORYNA) 3-0.02 MG tablet Take 1 tablet by mouth daily.   Yes Historical Provider, MD  promethazine (PHENERGAN) 25 MG tablet Take 25 mg by mouth every 6 (six) hours as needed for nausea or vomiting.   Yes Historical Provider, MD     Allergies:  Allergies  Allergen Reactions  . Citalopram Hydrobromide Hives  . Percocet [Oxycodone-Acetaminophen] Hives    History   Social History  . Marital Status: Single    Spouse Name: N/A  . Number of Children: N/A  . Years of Education: N/A   Occupational History  . Not on file.   Social History Main Topics  . Smoking status: Current Every Day Smoker -- 0.25 packs/day    Types: Cigarettes    . Smokeless tobacco: Never Used  . Alcohol Use: Yes     Comment: occasional drinker  . Drug Use: No  . Sexual Activity: Yes   Other Topics Concern  . Not on file   Social History Narrative     Family History  Problem Relation Age of Onset  . Epilepsy Father   . Lung cancer Maternal Grandfather   . Lung cancer Paternal Grandfather      ROS:  Please see the history of present illness.     All other systems reviewed and negative.    Physical Exam: BP 120/76 mmHg  Pulse 106  Ht 5\' 5"  (1.651 m)  Wt 107 lb 9.6 oz (48.807 kg)  BMI 17.91 kg/m2  Well developed and nourished in no acute distress HENT normal Neck supple with JVP-flat Clear Regular rate and rhythm, no murmurs or gallops Abd-soft with active BS No Clubbing cyanosis edema Skin-warm and dry A & Oriented  Grossly normal sensory and motor function     Labs: Cardiac Enzymes No results for input(s): CKTOTAL, CKMB, TROPONINI in the last 72 hours. CBC Lab Results  Component Value Date   WBC 15.3* 11/19/2011   HGB 12.4 11/19/2011   HCT 36.8 11/19/2011   MCV 92 11/19/2011   PLT 228 11/19/2011   No results found.  EKG: * sinus 13/08/39 Ow normal   Assessment and Plan:  Dysautonomia consistent with POTS  Hypertension  She is aware of multiple maneuvers that she can pursue to mitigate her symptoms. However this time her symptoms are sufficiently non-disruptive that she is pursuing only volume hydration and dietary salt repletion. We have reviewed the importance of glucose and sodium co-transport, the role of salt supplementation, the value  of recumbent exercise  BP better     .

## 2015-04-10 ENCOUNTER — Other Ambulatory Visit: Payer: Self-pay

## 2015-04-10 MED ORDER — METOPROLOL SUCCINATE ER 50 MG PO TB24: 50.0000 mg | ORAL_TABLET | Freq: Every day | ORAL | Status: DC

## 2015-07-08 ENCOUNTER — Other Ambulatory Visit: Payer: Self-pay | Admitting: Internal Medicine

## 2017-02-17 ENCOUNTER — Ambulatory Visit: Payer: No Typology Code available for payment source | Admitting: Physician Assistant

## 2017-04-29 ENCOUNTER — Ambulatory Visit (HOSPITAL_COMMUNITY): Payer: No Typology Code available for payment source

## 2017-07-27 NOTE — L&D Delivery Note (Addendum)
Delivery Note Just prior to delivery, SROM occurred and meconium stained fluid was noted; non-malodorous. At 4:37 AM a non-viable female was delivered via Vaginal, Spontaneous (Presentation: ROP).  APGAR: 0, 0; weight pending.  Placenta status: manual extraction; intact. 3V Cord with the following complications: none. Cord pH: n/a.  Immediate inspection reveals entirely normal umbilical cord and newborn.  Skin desquamation was noted after wiping skin dry.  No abnormal facies or features otherwise.    Anesthesia:  CLEA Episiotomy: None Lacerations: None Suture Repair: n/a Est. Blood Loss (mL): 106  Mom to postpartum.  [facility]  Patient wishes to remain with baby as long as possible.  Full lab workup has been drawn and patient continues to decline autopsy.  Placenta sent to pathology.  Patient declines sending tissue for genetics.   Marissa Mitchell 07/16/2018, 5:03 AM

## 2017-10-25 ENCOUNTER — Other Ambulatory Visit (HOSPITAL_COMMUNITY): Payer: Self-pay | Admitting: Obstetrics and Gynecology

## 2017-10-25 DIAGNOSIS — Z3141 Encounter for fertility testing: Secondary | ICD-10-CM

## 2017-11-01 ENCOUNTER — Ambulatory Visit (HOSPITAL_COMMUNITY): Payer: No Typology Code available for payment source

## 2017-11-02 ENCOUNTER — Ambulatory Visit (HOSPITAL_COMMUNITY)
Admission: RE | Admit: 2017-11-02 | Discharge: 2017-11-02 | Disposition: A | Discharge status: Home / Self Care | Payer: Self-pay | Source: Ambulatory Visit | Attending: Obstetrics and Gynecology | Admitting: Obstetrics and Gynecology

## 2017-11-02 DIAGNOSIS — N979 Female infertility, unspecified: Secondary | ICD-10-CM | POA: Insufficient documentation

## 2017-11-02 DIAGNOSIS — Z3141 Encounter for fertility testing: Secondary | ICD-10-CM

## 2017-11-02 MED ORDER — IOPAMIDOL (ISOVUE-300) INJECTION 61%
20.0000 mL | Freq: Once | INTRAVENOUS | Status: AC | PRN
  Administered 2017-11-02: 6 mL

## 2018-01-06 ENCOUNTER — Telehealth: Payer: Self-pay | Admitting: Internal Medicine

## 2018-01-06 NOTE — Telephone Encounter (Signed)
LVM for return call. 

## 2018-01-06 NOTE — Telephone Encounter (Signed)
New Message    Pt c/o medication issue:  1. Name of Medication: atenolol  2. How are you currently taking this medication (dosage and times per day)?   3. Are you having a reaction (difficulty breathing--STAT)?  4. What is your medication issue?  Patient is calling because her OBGYN is requesting that she be changed from atenolol to labetalol due to her pregnancy. Please call to discuss.

## 2018-01-07 NOTE — Telephone Encounter (Signed)
LVM for return call. 

## 2018-01-11 NOTE — Telephone Encounter (Signed)
Per Dr Graciela HusbandsKlein, pt should discontinue use of atenolol while pregnant. Pt should see advisement from her PCP and/or OB as to what she can take in replacement. Pt denies any symptoms of POTS at this time, but was concerned about coming off the medication abruptly. I stated she should consult her PCP or OB in regards to what would be safe to take while pregnant. Pt verbalized understanding and had no additional questions.

## 2018-07-15 ENCOUNTER — Encounter (HOSPITAL_COMMUNITY): Payer: Self-pay | Admitting: *Deleted

## 2018-07-15 ENCOUNTER — Inpatient Hospital Stay (HOSPITAL_COMMUNITY)
Admission: AD | Admit: 2018-07-15 | Discharge: 2018-07-16 | DRG: 807 | Disposition: A | Discharge status: Home / Self Care | Payer: Self-pay | Source: Ambulatory Visit | Attending: Obstetrics & Gynecology | Admitting: Obstetrics & Gynecology

## 2018-07-15 DIAGNOSIS — O99344 Other mental disorders complicating childbirth: Secondary | ICD-10-CM | POA: Diagnosis present

## 2018-07-15 DIAGNOSIS — F17210 Nicotine dependence, cigarettes, uncomplicated: Secondary | ICD-10-CM | POA: Diagnosis present

## 2018-07-15 DIAGNOSIS — F419 Anxiety disorder, unspecified: Secondary | ICD-10-CM | POA: Diagnosis present

## 2018-07-15 DIAGNOSIS — Z3A36 36 weeks gestation of pregnancy: Secondary | ICD-10-CM

## 2018-07-15 DIAGNOSIS — O364XX0 Maternal care for intrauterine death, not applicable or unspecified: Principal | ICD-10-CM | POA: Diagnosis present

## 2018-07-15 DIAGNOSIS — O99334 Smoking (tobacco) complicating childbirth: Secondary | ICD-10-CM | POA: Diagnosis present

## 2018-07-15 HISTORY — DX: Anxiety disorder, unspecified: F41.9

## 2018-07-15 LAB — OB RESULTS CONSOLE GC/CHLAMYDIA
Chlamydia: NEGATIVE
GC PROBE AMP, GENITAL: NEGATIVE

## 2018-07-15 LAB — FIBRINOGEN: Fibrinogen: 543 mg/dL — ABNORMAL HIGH (ref 210–475)

## 2018-07-15 LAB — COMPREHENSIVE METABOLIC PANEL
ALK PHOS: 133 U/L — AB (ref 38–126)
ALT: 15 U/L (ref 0–44)
AST: 26 U/L (ref 15–41)
Albumin: 3.4 g/dL — ABNORMAL LOW (ref 3.5–5.0)
Anion gap: 10 (ref 5–15)
BUN: 8 mg/dL (ref 6–20)
CO2: 21 mmol/L — ABNORMAL LOW (ref 22–32)
Calcium: 9.1 mg/dL (ref 8.9–10.3)
Chloride: 105 mmol/L (ref 98–111)
Creatinine, Ser: 0.65 mg/dL (ref 0.44–1.00)
GFR calc Af Amer: >60 mL/min (ref >60)
GFR calc non Af Amer: >60 mL/min (ref >60)
Glucose, Bld: 75 mg/dL (ref 70–99)
Potassium: 3.7 mmol/L (ref 3.5–5.1)
Sodium: 136 mmol/L (ref 135–145)
Total Bilirubin: <0.1 mg/dL — ABNORMAL LOW (ref 0.3–1.2)
Total Protein: 7 g/dL (ref 6.5–8.1)

## 2018-07-15 LAB — OB RESULTS CONSOLE ABO/RH: RH TYPE: POSITIVE

## 2018-07-15 LAB — RAPID URINE DRUG SCREEN, HOSP PERFORMED
Amphetamines: NOT DETECTED
Barbiturates: NOT DETECTED
Benzodiazepines: POSITIVE — AB
Cocaine: NOT DETECTED
Opiates: NOT DETECTED
Tetrahydrocannabinol: NOT DETECTED

## 2018-07-15 LAB — CBC
HCT: 37.3 % (ref 36.0–46.0)
Hemoglobin: 12.4 g/dL (ref 12.0–15.0)
MCH: 30.9 pg (ref 26.0–34.0)
MCHC: 33.2 g/dL (ref 30.0–36.0)
MCV: 93 fL (ref 80.0–100.0)
Platelets: 266 10*3/uL (ref 150–400)
RBC: 4.01 MIL/uL (ref 3.87–5.11)
RDW: 13.7 % (ref 11.5–15.5)
WBC: 12.5 10*3/uL — ABNORMAL HIGH (ref 4.0–10.5)
nRBC: 0 % (ref 0.0–0.2)

## 2018-07-15 LAB — KLEIHAUER-BETKE STAIN
# Vials RhIg: 1
FETAL CELLS %: 0 %
Quantitation Fetal Hemoglobin: 0 mL

## 2018-07-15 LAB — TYPE AND SCREEN
ABO/RH(D): O POS
Antibody Screen: NEGATIVE

## 2018-07-15 LAB — OB RESULTS CONSOLE RUBELLA ANTIBODY, IGM: RUBELLA: IMMUNE

## 2018-07-15 LAB — OB RESULTS CONSOLE ANTIBODY SCREEN: ANTIBODY SCREEN: NEGATIVE

## 2018-07-15 LAB — TSH: TSH: 1.567 u[IU]/mL (ref 0.350–4.500)

## 2018-07-15 LAB — ABO/RH: ABO/RH(D): O POS

## 2018-07-15 LAB — OB RESULTS CONSOLE HEPATITIS B SURFACE ANTIGEN: Hepatitis B Surface Ag: NEGATIVE

## 2018-07-15 LAB — OB RESULTS CONSOLE HIV ANTIBODY (ROUTINE TESTING): HIV: NONREACTIVE

## 2018-07-15 LAB — AMNISURE RUPTURE OF MEMBRANE (ROM) NOT AT ARMC: AMNISURE: NEGATIVE

## 2018-07-15 LAB — OB RESULTS CONSOLE RPR: RPR: NONREACTIVE

## 2018-07-15 MED ORDER — FENTANYL CITRATE (PF) 100 MCG/2ML IJ SOLN
50.0000 ug | INTRAMUSCULAR | Status: DC | PRN
  Administered 2018-07-16: 50 ug via INTRAVENOUS
  Filled 2018-07-15: qty 2

## 2018-07-15 MED ORDER — DIPHENHYDRAMINE HCL 50 MG/ML IJ SOLN: 12.5000 mg | INTRAMUSCULAR | Status: DC | PRN

## 2018-07-15 MED ORDER — ONDANSETRON HCL 4 MG/2ML IJ SOLN
4.0000 mg | Freq: Four times a day (QID) | INTRAMUSCULAR | Status: DC | PRN
  Administered 2018-07-16: 4 mg via INTRAVENOUS
  Filled 2018-07-15: qty 2

## 2018-07-15 MED ORDER — LORAZEPAM 2 MG/ML IJ SOLN
1.0000 mg | INTRAMUSCULAR | Status: DC | PRN
  Administered 2018-07-15 – 2018-07-16 (×4): 1 mg via INTRAVENOUS
  Filled 2018-07-15 (×6): qty 0.5

## 2018-07-15 MED ORDER — LACTATED RINGERS IV SOLN
500.0000 mL | Freq: Once | INTRAVENOUS | Status: AC
  Administered 2018-07-16: 500 mL via INTRAVENOUS

## 2018-07-15 MED ORDER — OXYTOCIN BOLUS FROM INFUSION
500.0000 mL | Freq: Once | INTRAVENOUS | Status: AC
  Administered 2018-07-16: 500 mL via INTRAVENOUS

## 2018-07-15 MED ORDER — LACTATED RINGERS IV SOLN: 500.0000 mL | INTRAVENOUS | Status: DC | PRN

## 2018-07-15 MED ORDER — PHENYLEPHRINE 40 MCG/ML (10ML) SYRINGE FOR IV PUSH (FOR BLOOD PRESSURE SUPPORT)
80.0000 ug | PREFILLED_SYRINGE | INTRAVENOUS | Status: DC | PRN
  Filled 2018-07-15 (×2): qty 10

## 2018-07-15 MED ORDER — EPHEDRINE 5 MG/ML INJ
10.0000 mg | INTRAVENOUS | Status: DC | PRN
  Filled 2018-07-15: qty 2

## 2018-07-15 MED ORDER — MISOPROSTOL 50MCG HALF TABLET
50.0000 ug | ORAL_TABLET | ORAL | Status: DC
  Administered 2018-07-15 (×2): 50 ug via VAGINAL
  Filled 2018-07-15 (×4): qty 1

## 2018-07-15 MED ORDER — SOD CITRATE-CITRIC ACID 500-334 MG/5ML PO SOLN: 30.0000 mL | ORAL | Status: DC | PRN

## 2018-07-15 MED ORDER — FENTANYL 2.5 MCG/ML BUPIVACAINE 1/10 % EPIDURAL INFUSION (WH - ANES)
14.0000 mL/h | INTRAMUSCULAR | Status: DC | PRN
  Administered 2018-07-16: 14 mL/h via EPIDURAL
  Filled 2018-07-15: qty 100

## 2018-07-15 MED ORDER — PHENYLEPHRINE 40 MCG/ML (10ML) SYRINGE FOR IV PUSH (FOR BLOOD PRESSURE SUPPORT)
80.0000 ug | PREFILLED_SYRINGE | INTRAVENOUS | Status: DC | PRN
  Filled 2018-07-15: qty 10

## 2018-07-15 MED ORDER — OXYTOCIN 40 UNITS IN LACTATED RINGERS INFUSION - SIMPLE MED
2.5000 [IU]/h | INTRAVENOUS | Status: DC
  Filled 2018-07-15: qty 1000

## 2018-07-15 MED ORDER — LIDOCAINE HCL (PF) 1 % IJ SOLN
30.0000 mL | INTRAMUSCULAR | Status: DC | PRN
  Filled 2018-07-15: qty 30

## 2018-07-15 MED ORDER — FLEET ENEMA 7-19 GM/118ML RE ENEM: 1.0000 | ENEMA | RECTAL | Status: DC | PRN

## 2018-07-15 MED ORDER — LACTATED RINGERS IV SOLN
INTRAVENOUS | Status: DC
  Administered 2018-07-15: 17:00:00 via INTRAVENOUS

## 2018-07-15 NOTE — H&P (Signed)
Marissa FurlongHannah B Mitchell is a 27 y.o. female presenting for induction of labor secondary to IUFD.  The patient presented to the office today for evaluation of decreased fetal movement.  IUFD was diagnosed on u/s; subjectively low amniotic fluid was noted, ascites and abnormal head.  Femur length measured c/w 31 weeks.  Antepartum course complicated by POTS; d/c beta blocker in pregnancy.  Patient has long-standing anxiety and has taken xanax 0.5 mg prn this pregnancy (last dose 0830 this morning).  Patient is a smoker.  Panorama nl female.    OB History    Gravida  1   Para      Term      Preterm      AB      Living        SAB      TAB      Ectopic      Multiple      Live Births  0          Past Medical History:  Diagnosis Date  . Anxiety    panick attacks  . POTS (postural orthostatic tachycardia syndrome)   . POTS (postural orthostatic tachycardia syndrome)    Past Surgical History:  Procedure Laterality Date  . BREAST ENHANCEMENT SURGERY    . COSMETIC SURGERY    . RHINOPLASTY     Family History: family history includes Epilepsy in her father; Lung cancer in her maternal grandfather and paternal grandfather. Social History:  reports that she has been smoking cigarettes. She has been smoking about 0.25 packs per day. She has never used smokeless tobacco. She reports current alcohol use. She reports that she does not use drugs.     Maternal Diabetes: No Genetic Screening: Normal Maternal Ultrasounds/Referrals: Normal Fetal Ultrasounds or other Referrals:  None Maternal Substance Abuse:  Yes:  Type: Smoker Significant Maternal Medications:  Meds include: Other: xanax Significant Maternal Lab Results:  None Other Comments:  None  ROS Maternal Medical History:  Fetal activity: Perceived fetal activity is none.   Last perceived fetal movement was greater than 24 hours ago.    Prenatal complications: no prenatal complications Prenatal Complications - Diabetes:  none.      Height 5\' 4"  (1.626 m), weight 61.9 kg, last menstrual period 10/25/2017. Maternal Exam:  Abdomen: Patient reports no abdominal tenderness. Fundal height is S<D.   Fetal presentation: vertex     Physical Exam  Constitutional: She is oriented to person, place, and time. She appears well-developed and well-nourished.  GI: Soft. There is no rebound and no guarding.  Neurological: She is alert and oriented to person, place, and time.  Skin: Skin is warm and dry.  Psychiatric: She has a normal mood and affect. Her behavior is normal.    Prenatal labs: ABO, Rh: O/Positive/-- (12/20 0000) Antibody: Negative (12/20 0000) Rubella: Immune (12/20 0000) RPR: Nonreactive (12/20 0000)  HBsAg: Negative (12/20 0000)  HIV: Non-reactive (12/20 0000)  GBS:     Assessment/Plan: 27yo G1 at 36 weeks with IUFD -IUFD lab workup drawn -Patient declines autopsy -IV narcotics/CLEA if desired   Marissa HonourMegan Bralyn Mitchell 07/15/2018, 4:39 PM

## 2018-07-15 NOTE — Progress Notes (Signed)
CVX 1/25/-2, VTX Will do vmp 50 mcg.  Mitchel HonourMegan Jacora Hopkins, DO

## 2018-07-16 ENCOUNTER — Other Ambulatory Visit: Payer: Self-pay

## 2018-07-16 ENCOUNTER — Inpatient Hospital Stay (HOSPITAL_COMMUNITY): Payer: Self-pay | Admitting: Anesthesiology

## 2018-07-16 ENCOUNTER — Encounter (HOSPITAL_COMMUNITY): Payer: Self-pay

## 2018-07-16 LAB — LUPUS ANTICOAGULANT
DRVVT: 38.5 s (ref 0.0–47.0)
PTT Lupus Anticoagulant: 33.2 s (ref 0.0–51.9)
Thrombin Time: 16.6 s (ref 0.0–23.0)
dPT Confirm Ratio: 1.09 Ratio (ref 0.00–1.40)
dPT: 36.8 s (ref 0.0–55.0)

## 2018-07-16 LAB — CARDIOLIPIN ANTIBODIES, IGG, IGM, IGA
Anticardiolipin IgA: <9 APL U/mL (ref 0–11)
Anticardiolipin IgG: 9 GPL U/mL (ref 0–14)
Anticardiolipin IgM: <9 MPL U/mL (ref 0–12)

## 2018-07-16 LAB — RPR: RPR Ser Ql: NONREACTIVE

## 2018-07-16 MED ORDER — DIBUCAINE 1 % RE OINT: 1.0000 "application " | TOPICAL_OINTMENT | RECTAL | Status: DC | PRN

## 2018-07-16 MED ORDER — ZOLPIDEM TARTRATE 5 MG PO TABS: 5.0000 mg | ORAL_TABLET | Freq: Every evening | ORAL | Status: DC | PRN

## 2018-07-16 MED ORDER — WITCH HAZEL-GLYCERIN EX PADS: 1.0000 "application " | MEDICATED_PAD | CUTANEOUS | Status: DC | PRN

## 2018-07-16 MED ORDER — IBUPROFEN 600 MG PO TABS
600.0000 mg | ORAL_TABLET | Freq: Four times a day (QID) | ORAL | Status: DC
  Administered 2018-07-16: 600 mg via ORAL
  Filled 2018-07-16: qty 1

## 2018-07-16 MED ORDER — PRENATAL MULTIVITAMIN CH: 1.0000 | ORAL_TABLET | Freq: Every day | ORAL | Status: DC

## 2018-07-16 MED ORDER — LACTATED RINGERS IV SOLN: 500.0000 mL | Freq: Once | INTRAVENOUS | Status: DC

## 2018-07-16 MED ORDER — SENNOSIDES-DOCUSATE SODIUM 8.6-50 MG PO TABS: 2.0000 | ORAL_TABLET | ORAL | Status: DC

## 2018-07-16 MED ORDER — COCONUT OIL OIL: 1.0000 "application " | TOPICAL_OIL | Status: DC | PRN

## 2018-07-16 MED ORDER — IBUPROFEN 600 MG PO TABS: 600.0000 mg | ORAL_TABLET | Freq: Four times a day (QID) | ORAL | 0 refills | Status: DC

## 2018-07-16 MED ORDER — EPHEDRINE 5 MG/ML INJ: 10.0000 mg | INTRAVENOUS | Status: DC | PRN

## 2018-07-16 MED ORDER — DIPHENHYDRAMINE HCL 25 MG PO CAPS: 25.0000 mg | ORAL_CAPSULE | Freq: Four times a day (QID) | ORAL | Status: DC | PRN

## 2018-07-16 MED ORDER — ACETAMINOPHEN 325 MG PO TABS: 650.0000 mg | ORAL_TABLET | ORAL | Status: DC | PRN

## 2018-07-16 MED ORDER — LIDOCAINE HCL (PF) 1 % IJ SOLN
INTRAMUSCULAR | Status: DC | PRN
  Administered 2018-07-16: 5 mL via EPIDURAL

## 2018-07-16 MED ORDER — TETANUS-DIPHTH-ACELL PERTUSSIS 5-2.5-18.5 LF-MCG/0.5 IM SUSP: 0.5000 mL | Freq: Once | INTRAMUSCULAR | Status: DC

## 2018-07-16 MED ORDER — BENZOCAINE-MENTHOL 20-0.5 % EX AERO: 1.0000 "application " | INHALATION_SPRAY | CUTANEOUS | Status: DC | PRN

## 2018-07-16 MED ORDER — SIMETHICONE 80 MG PO CHEW: 80.0000 mg | CHEWABLE_TABLET | ORAL | Status: DC | PRN

## 2018-07-16 MED ORDER — PHENYLEPHRINE 40 MCG/ML (10ML) SYRINGE FOR IV PUSH (FOR BLOOD PRESSURE SUPPORT): 80.0000 ug | PREFILLED_SYRINGE | INTRAVENOUS | Status: DC | PRN

## 2018-07-16 MED ORDER — ONDANSETRON HCL 4 MG/2ML IJ SOLN: 4.0000 mg | INTRAMUSCULAR | Status: DC | PRN

## 2018-07-16 MED ORDER — ONDANSETRON HCL 4 MG PO TABS: 4.0000 mg | ORAL_TABLET | ORAL | Status: DC | PRN

## 2018-07-16 NOTE — Progress Notes (Signed)
Pt was standing in room holding baby Sebastion when I arrived. Her husband (or SO) was with her. She said she was trying to keep going. She was tearful but composed. They wanted a baptism for infant and prayer with family. We discussed a type of brief service that would be meaningful to them. I provided words to encourage and strengthen them, read Scripture, followed by baptism and prayer. Family was very appreciative of services provided. I provided empathic support to each family member. Please page if additional assistance is needed. Chaplain Marjory Liesamela Kalah Pflum Holder, MDiv   07/16/18 0700  Clinical Encounter Type  Visited With Patient and family together

## 2018-07-16 NOTE — Anesthesia Preprocedure Evaluation (Signed)
Anesthesia Evaluation  Patient identified by MRN, date of birth, ID band Patient awake    Airway Mallampati: I       Dental no notable dental hx.    Pulmonary Current Smoker,    Pulmonary exam normal        Cardiovascular Normal cardiovascular exam     Neuro/Psych    GI/Hepatic   Endo/Other    Renal/GU      Musculoskeletal   Abdominal   Peds  Hematology   Anesthesia Other Findings   Reproductive/Obstetrics (+) Pregnancy                             Anesthesia Physical Anesthesia Plan  ASA: II  Anesthesia Plan: Epidural   Post-op Pain Management:    Induction:   PONV Risk Score and Plan:   Airway Management Planned:   Additional Equipment:   Intra-op Plan:   Post-operative Plan:   Informed Consent: I have reviewed the patients History and Physical, chart, labs and discussed the procedure including the risks, benefits and alternatives for the proposed anesthesia with the patient or authorized representative who has indicated his/her understanding and acceptance.     Plan Discussed with:   Anesthesia Plan Comments: (Lab Results      Component                Value               Date                      WBC                      12.5 (H)            07/15/2018                HGB                      12.4                07/15/2018                HCT                      37.3                07/15/2018                MCV                      93.0                07/15/2018                PLT                      266                 07/15/2018           )        Anesthesia Quick Evaluation

## 2018-07-16 NOTE — Discharge Instructions (Signed)
Call MD for T>100.4, heavy vaginal bleeding, severe abdominal pain, or respiratory distress.  Call office to schedule postpartum visit in 2 weeks.  Pelvic rest x 4 weeks.

## 2018-07-16 NOTE — Progress Notes (Signed)
Attempted to round on pt but she and her partner just fell asleep for the first time.   Will revisit in a few hours.  Mitchel HonourMegan Kerensa Nicklas, DO

## 2018-07-16 NOTE — Anesthesia Procedure Notes (Signed)
Epidural Patient location during procedure: OB Start time: 07/16/2018 1:02 AM End time: 07/16/2018 1:08 AM  Staffing Anesthesiologist: Shelton SilvasHollis, Ezrael Sam D, MD Performed: anesthesiologist   Preanesthetic Checklist Completed: patient identified, site marked, surgical consent, pre-op evaluation, timeout performed, IV checked, risks and benefits discussed and monitors and equipment checked  Epidural Patient position: sitting Prep: ChloraPrep Patient monitoring: heart rate, continuous pulse ox and blood pressure Approach: midline Location: L3-L4 Injection technique: LOR saline  Needle:  Needle type: Tuohy  Needle gauge: 17 G Needle length: 9 cm Catheter type: closed end flexible Catheter size: 20 Guage Test dose: negative and 1.5% lidocaine  Assessment Events: blood not aspirated, injection not painful, no injection resistance and no paresthesia  Additional Notes LOR @ 4  Patient identified. Risks/Benefits/Options discussed with patient including but not limited to bleeding, infection, nerve damage, paralysis, failed block, incomplete pain control, headache, blood pressure changes, nausea, vomiting, reactions to medications, itching and postpartum back pain. Confirmed with bedside nurse the patient's most recent platelet count. Confirmed with patient that they are not currently taking any anticoagulation, have any bleeding history or any family history of bleeding disorders. Patient expressed understanding and wished to proceed. All questions were answered. Sterile technique was used throughout the entire procedure. Please see nursing notes for vital signs. Test dose was given through epidural catheter and negative prior to continuing to dose epidural or start infusion. Warning signs of high block given to the patient including shortness of breath, tingling/numbness in hands, complete motor block, or any concerning symptoms with instructions to call for help. Patient was given instructions  on fall risk and not to get out of bed. All questions and concerns addressed with instructions to call with any issues or inadequate analgesia.    Reason for block:procedure for pain

## 2018-07-16 NOTE — Progress Notes (Signed)
Post Partum Day 0 s/p SVD of IUFD Subjective: no complaints, up ad lib, voiding and tolerating PO.  Patient wants to go home  Objective: Blood pressure 110/71, pulse 77, temperature 98.7 F (37.1 C), temperature source Oral, resp. rate 18, height 5\' 4"  (1.626 m), weight 61.9 kg, last menstrual period 10/25/2017, SpO2 99 %.  Physical Exam:  General: alert, cooperative and appears stated age Lochia: appropriate Uterine Fundus: firm Incision: n/a DVT Evaluation: No evidence of DVT seen on physical exam. Negative Homan's sign. No cords or calf tenderness.  Recent Labs    07/15/18 1700  HGB 12.4  HCT 37.3  IUFD w/u nl at this time.  Some labs still pending.  Assessment/Plan: Discharge home Baby to funeral home   LOS: 1 day   Mitchel HonourMegan Sumeet Geter 07/16/2018, 3:38 PM

## 2018-07-16 NOTE — Discharge Summary (Signed)
Obstetric Discharge Summary Reason for Admission: induction of labor for IUFD at 36 weeks Prenatal Procedures: none Intrapartum Procedures: spontaneous vaginal delivery Postpartum Procedures: none Complications-Operative and Postpartum: none Hemoglobin  Date Value Ref Range Status  07/15/2018 12.4 12.0 - 15.0 g/dL Final   HGB  Date Value Ref Range Status  11/19/2011 12.4 12.0 - 16.0 g/dL Final   HCT  Date Value Ref Range Status  07/15/2018 37.3 36.0 - 46.0 % Final  11/19/2011 36.8 35.0 - 47.0 % Final    Physical Exam:  General: alert, cooperative and appears stated age Lochia: appropriate Uterine Fundus: firm Incision: n/a DVT Evaluation: No evidence of DVT seen on physical exam. Negative Homan's sign. No cords or calf tenderness.  Discharge Diagnoses: IUFD at 36 weeks; delivered  Discharge Information: Date: 07/16/2018 Activity: pelvic rest Diet: routine Medications: PNV and Ibuprofen Condition: stable Instructions: refer to practice specific booklet Discharge to: home   Newborn Data: Live born female  Birth Weight: 4 lb 11 oz (2126 g) APGAR: ,   Newborn Delivery   Birth date/time:  07/16/2018 04:37:00 Delivery type:  Vaginal, Spontaneous     Home with funeral home.  Marissa Mitchell 07/16/2018, 3:42 PM

## 2018-07-19 LAB — BETA-2-GLYCOPROTEIN I ABS, IGG/M/A
Beta-2 Glyco I IgG: <9 GPI IgG units (ref 0–20)
Beta-2-Glycoprotein I IgA: <9 GPI IgA units (ref 0–25)
Beta-2-Glycoprotein I IgM: <9 GPI IgM units (ref 0–32)

## 2018-07-19 LAB — TORCH-IGM(TOXO/ RUB/ CMV/ HSV) W TITER
HSVI/II Comb IgM: 1.48 Ratio — ABNORMAL HIGH (ref 0.00–0.90)
Rubella IgM: <20 AU/mL (ref 0.0–19.9)
Toxoplasma Antibody- IgM: <3 AU/mL (ref 0.0–7.9)

## 2018-07-19 LAB — INFECT DISEASE AB IGM REFLEX 1

## 2018-07-19 NOTE — Anesthesia Postprocedure Evaluation (Signed)
Anesthesia Post Note  Patient: Marissa FurlongHannah B Mitchell  Procedure(s) Performed: AN AD HOC LABOR EPIDURAL     Patient location during evaluation: Mother Baby Anesthesia Type: Epidural Level of consciousness: awake and alert Pain management: pain level controlled Vital Signs Assessment: post-procedure vital signs reviewed and stable Respiratory status: spontaneous breathing, nonlabored ventilation and respiratory function stable Cardiovascular status: stable Postop Assessment: no headache, no backache and epidural receding Anesthetic complications: no    Last Vitals: There were no vitals filed for this visit.  Last Pain: There were no vitals filed for this visit.               Shelton SilvasKevin D Mekenzie Modeste

## 2018-07-27 NOTE — L&D Delivery Note (Signed)
Delivery Note At 10:49 AM a viable female was delivered via Vaginal, Spontaneous NSVD in OP position  APGAR: 8 9  weight  Pending but IUGR.   Placenta status:spontaneously with 3 vessel cord , .  Cord:  with the following complications:none .  Cord pH: not obtained  Anesthesia:  epidural Episiotomy: None Lacerations:  none Suture Repair: not applicable Est. Blood Loss (mL):  300  Mom to postpartum.  Baby to Couplet care / Skin to Skin.  [name] 05/25/2019, 10:58 AM

## 2018-10-03 ENCOUNTER — Encounter: Payer: Self-pay | Admitting: Internal Medicine

## 2018-10-03 ENCOUNTER — Ambulatory Visit (INDEPENDENT_AMBULATORY_CARE_PROVIDER_SITE_OTHER): Payer: Self-pay | Admitting: Internal Medicine

## 2018-10-03 DIAGNOSIS — G90A Postural orthostatic tachycardia syndrome (POTS): Secondary | ICD-10-CM

## 2018-10-03 DIAGNOSIS — R000 Tachycardia, unspecified: Secondary | ICD-10-CM

## 2018-10-03 DIAGNOSIS — R894 Abnormal immunological findings in specimens from other organs, systems and tissues: Secondary | ICD-10-CM | POA: Insufficient documentation

## 2018-10-03 DIAGNOSIS — F17210 Nicotine dependence, cigarettes, uncomplicated: Secondary | ICD-10-CM | POA: Insufficient documentation

## 2018-10-03 DIAGNOSIS — E282 Polycystic ovarian syndrome: Secondary | ICD-10-CM | POA: Insufficient documentation

## 2018-10-03 DIAGNOSIS — I951 Orthostatic hypotension: Secondary | ICD-10-CM

## 2018-10-03 DIAGNOSIS — F419 Anxiety disorder, unspecified: Secondary | ICD-10-CM | POA: Insufficient documentation

## 2018-10-03 NOTE — Progress Notes (Signed)
[facility]  Reason for Consult: Herpes simplex antibody positive Referring Provider: Dr. Mitchel Honour  Assessment: The explanation for her positive HSV antibodies is not entirely clear to me.  Her history and exam do not support previous symptomatic bouts of fever blisters or genital herpes.  The fact that her IgM antibody is positive suggest the possibility of a recent infection but she and her husband think that is extremely unlikely.  The fact that her IgM antibody is still +2-1/2 months later suggest to me the possibility that these are false positive results.  I told her that currently there is no test that can be done will let us know for certain if she has a symptomatic infection or if this is a false positive.  It might be reasonable to repeat her IgM antibody in another 3 months.  If it represents recent infection I would expect the IgM antibody to disappear soon.  I would not do any further testing as this is unlikely to alter her management while increasing her anxiety.  I would be happy to see her back as needed.  Plan: 1. Consider repeat HSV IgM antibody in 3 months  Patient Active Problem List   Diagnosis Date Noted  . IUFD at 20 weeks or more of gestation 07/15/2018    Patient's Medications  New Prescriptions   No medications on file  Previous Medications   ALPRAZOLAM (XANAX) 1 MG TABLET    alprazolam 1 mg tablet  TAKE 1/2 TO ONE TABLET 3 TIMES A DAY AS NEEDED FOR ANXIETY OR INSOMNIA.   ATENOLOL (TENORMIN) 25 MG TABLET    atenolol 25 mg tablet  TAKE 1 TABLET BY MOUTH EVERY DAY   FOLIC ACID (FOLVITE) 1 MG TABLET    folic acid 1 mg tablet  TAKE 1 TABLET BY MOUTH EVERY DAY   IBUPROFEN (ADVIL,MOTRIN) 600 MG TABLET    Take 1 tablet (600 mg total) by mouth every 6 (six) hours.   MONTELUKAST (SINGULAIR) 10 MG TABLET    TAKE 1 TABLET BY MOUTH DAILY. FOR ALLERGIES CONTROL   ONDANSETRON (ZOFRAN) 4 MG TABLET    Take one tablet 3 times a day as  needed for nausea  Modified Medications   No medications on file  Discontinued Medications   No medications on file    HPI: Marissa Mitchell is a 28 y.o. female who was pregnant last year.  She noted sudden loss of fetal movement on 07/15/2018.  She underwent induction of labor for intrauterine fetal death.  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  She says that she has never had fever blisters or any lesions that were concerning for genital herpes.  She is in a mutually monogamous relationship with her husband and he has no history of herpes.  Have blood work repeated last week.  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  Review of Systems: Review of Systems  Constitutional: Negative for chills, diaphoresis, fever, malaise/fatigue and weight loss.  Genitourinary: Negative for dysuria.       She has had recurrent episodes of red, raised lesions on her mons pubis.  She says they come every few months.  She and her mother describe them as a bump.  She denies that they have ever looked like blisters or crusted over.  Psychiatric/Behavioral: Negative for depression. The patient is nervous/anxious.       Past Medical History:  Diagnosis Date  . Anxiety    panick attacks  . POTS (postural orthostatic tachycardia syndrome)   . POTS (postural orthostatic tachycardia syndrome)     Social History   Tobacco Use  . Smoking status: Current Every Day Smoker    Packs/day: 0.25    Types: Cigarettes  . Smokeless tobacco: Never Used  Substance Use Topics  . Alcohol use: Yes    Comment: occasional drinker  . Drug use: No    Family History  Problem Relation Age of Onset  . Epilepsy Father   . Lung cancer Maternal Grandfather   . Lung cancer Paternal Grandfather    Allergies  Allergen Reactions  . Citalopram Hydrobromide  Hives  . Percocet [Oxycodone-Acetaminophen] Hives    OBJECTIVE: Vitals:   10/03/18 1401  BP: 124/78  Pulse: 91  Temp: 98.1 F (36.7 C)  Weight: 113 lb (51.3 kg)   Body mass index is 19.4 kg/m.   Physical Exam Constitutional:      Comments: She is anxious.  She is accompanied by her husband and her mother.  HENT:     Mouth/Throat:     Pharynx: No oropharyngeal exudate.     Comments: No lesions intraorally or on her lips. Genitourinary:    Comments: She has 1 small red area on the left side of her mons pubis.  It is not ulcerated. Psychiatric:        Mood and Affect: Mood normal.     Microbiology: No results found for this or any previous visit (from the past 240 hour(s)).  Cliffton Asters, MD [facility] [phone] pager   [phone] cell 10/03/2018, 2:38 PM

## 2018-11-30 LAB — OB RESULTS CONSOLE ANTIBODY SCREEN: Antibody Screen: NEGATIVE

## 2018-11-30 LAB — OB RESULTS CONSOLE HIV ANTIBODY (ROUTINE TESTING): HIV: NONREACTIVE

## 2018-11-30 LAB — OB RESULTS CONSOLE ABO/RH: RH Type: POSITIVE

## 2018-11-30 LAB — OB RESULTS CONSOLE HEPATITIS B SURFACE ANTIGEN: Hepatitis B Surface Ag: NEGATIVE

## 2018-11-30 LAB — OB RESULTS CONSOLE RPR: RPR: NONREACTIVE

## 2018-11-30 LAB — OB RESULTS CONSOLE RUBELLA ANTIBODY, IGM: Rubella: IMMUNE

## 2018-12-10 ENCOUNTER — Other Ambulatory Visit: Payer: Self-pay

## 2018-12-10 ENCOUNTER — Encounter (HOSPITAL_COMMUNITY): Payer: Self-pay

## 2019-01-26 ENCOUNTER — Encounter (HOSPITAL_COMMUNITY): Payer: Self-pay

## 2019-04-01 ENCOUNTER — Encounter (HOSPITAL_COMMUNITY): Payer: Self-pay

## 2019-04-01 ENCOUNTER — Inpatient Hospital Stay (HOSPITAL_COMMUNITY): Payer: Self-pay

## 2019-04-01 ENCOUNTER — Inpatient Hospital Stay (HOSPITAL_COMMUNITY)
Admission: AD | Admit: 2019-04-01 | Discharge: 2019-04-02 | DRG: 833 | Disposition: A | Discharge status: Home / Self Care | Payer: Self-pay | Attending: Obstetrics and Gynecology | Admitting: Obstetrics and Gynecology

## 2019-04-01 ENCOUNTER — Other Ambulatory Visit: Payer: Self-pay

## 2019-04-01 DIAGNOSIS — Z3A28 28 weeks gestation of pregnancy: Secondary | ICD-10-CM

## 2019-04-01 DIAGNOSIS — Z87440 Personal history of urinary (tract) infections: Secondary | ICD-10-CM

## 2019-04-01 DIAGNOSIS — O2303 Infections of kidney in pregnancy, third trimester: Principal | ICD-10-CM | POA: Diagnosis present

## 2019-04-01 DIAGNOSIS — R109 Unspecified abdominal pain: Secondary | ICD-10-CM

## 2019-04-01 DIAGNOSIS — F17210 Nicotine dependence, cigarettes, uncomplicated: Secondary | ICD-10-CM | POA: Diagnosis present

## 2019-04-01 DIAGNOSIS — Z20828 Contact with and (suspected) exposure to other viral communicable diseases: Secondary | ICD-10-CM | POA: Diagnosis present

## 2019-04-01 DIAGNOSIS — O99333 Smoking (tobacco) complicating pregnancy, third trimester: Secondary | ICD-10-CM | POA: Diagnosis present

## 2019-04-01 LAB — CBC WITH DIFFERENTIAL/PLATELET
Abs Immature Granulocytes: 0.19 10*3/uL — ABNORMAL HIGH (ref 0.00–0.07)
Basophils Absolute: 0 10*3/uL (ref 0.0–0.1)
Basophils Relative: 0 %
Eosinophils Absolute: 0.1 10*3/uL (ref 0.0–0.5)
Eosinophils Relative: 1 %
HCT: 32 % — ABNORMAL LOW (ref 36.0–46.0)
Hemoglobin: 10.5 g/dL — ABNORMAL LOW (ref 12.0–15.0)
Immature Granulocytes: 2 %
Lymphocytes Relative: 17 %
Lymphs Abs: 2.1 10*3/uL (ref 0.7–4.0)
MCH: 30 pg (ref 26.0–34.0)
MCHC: 32.8 g/dL (ref 30.0–36.0)
MCV: 91.4 fL (ref 80.0–100.0)
Monocytes Absolute: 0.8 10*3/uL (ref 0.1–1.0)
Monocytes Relative: 7 %
Neutro Abs: 8.9 10*3/uL — ABNORMAL HIGH (ref 1.7–7.7)
Neutrophils Relative %: 73 %
Platelets: 226 10*3/uL (ref 150–400)
RBC: 3.5 MIL/uL — ABNORMAL LOW (ref 3.87–5.11)
RDW: 13.5 % (ref 11.5–15.5)
WBC: 12.1 10*3/uL — ABNORMAL HIGH (ref 4.0–10.5)
nRBC: 0 % (ref 0.0–0.2)

## 2019-04-01 LAB — URINALYSIS, ROUTINE W REFLEX MICROSCOPIC
Bilirubin Urine: NEGATIVE
Glucose, UA: NEGATIVE mg/dL
Ketones, ur: NEGATIVE mg/dL
Nitrite: NEGATIVE
Protein, ur: NEGATIVE mg/dL
Specific Gravity, Urine: 1.004 — ABNORMAL LOW (ref 1.005–1.030)
WBC, UA: >50 WBC/hpf — ABNORMAL HIGH (ref 0–5)
pH: 7 (ref 5.0–8.0)

## 2019-04-01 LAB — COMPREHENSIVE METABOLIC PANEL
ALT: 15 U/L (ref 0–44)
AST: 23 U/L (ref 15–41)
Albumin: 3 g/dL — ABNORMAL LOW (ref 3.5–5.0)
Alkaline Phosphatase: 66 U/L (ref 38–126)
Anion gap: 9 (ref 5–15)
BUN: 6 mg/dL (ref 6–20)
CO2: 24 mmol/L (ref 22–32)
Calcium: 8.8 mg/dL — ABNORMAL LOW (ref 8.9–10.3)
Chloride: 103 mmol/L (ref 98–111)
Creatinine, Ser: 0.6 mg/dL (ref 0.44–1.00)
GFR calc Af Amer: >60 mL/min (ref >60)
GFR calc non Af Amer: >60 mL/min (ref >60)
Glucose, Bld: 82 mg/dL (ref 70–99)
Potassium: 3.7 mmol/L (ref 3.5–5.1)
Sodium: 136 mmol/L (ref 135–145)
Total Bilirubin: 0.2 mg/dL — ABNORMAL LOW (ref 0.3–1.2)
Total Protein: 6.1 g/dL — ABNORMAL LOW (ref 6.5–8.1)

## 2019-04-01 LAB — TYPE AND SCREEN
ABO/RH(D): O POS
Antibody Screen: NEGATIVE

## 2019-04-01 LAB — ABO/RH: ABO/RH(D): O POS

## 2019-04-01 LAB — SARS CORONAVIRUS 2 BY RT PCR (HOSPITAL ORDER, PERFORMED IN ~~LOC~~ HOSPITAL LAB): SARS Coronavirus 2: NEGATIVE

## 2019-04-01 MED ORDER — ACETAMINOPHEN 500 MG PO TABS
1000.0000 mg | ORAL_TABLET | Freq: Once | ORAL | Status: AC
  Administered 2019-04-01: 1000 mg via ORAL
  Filled 2019-04-01: qty 2

## 2019-04-01 MED ORDER — ZOLPIDEM TARTRATE 5 MG PO TABS: 5.0000 mg | ORAL_TABLET | Freq: Every evening | ORAL | Status: DC | PRN

## 2019-04-01 MED ORDER — DOCUSATE SODIUM 100 MG PO CAPS
100.0000 mg | ORAL_CAPSULE | Freq: Every day | ORAL | Status: DC
  Administered 2019-04-01: 12:00:00 100 mg via ORAL
  Filled 2019-04-01: qty 1

## 2019-04-01 MED ORDER — PRENATAL MULTIVITAMIN CH
1.0000 | ORAL_TABLET | Freq: Every day | ORAL | Status: DC
  Filled 2019-04-01: qty 1

## 2019-04-01 MED ORDER — CALCIUM CARBONATE ANTACID 500 MG PO CHEW: 2.0000 | CHEWABLE_TABLET | ORAL | Status: DC | PRN

## 2019-04-01 MED ORDER — SODIUM CHLORIDE 0.9 % IV SOLN
2.0000 g | INTRAVENOUS | Status: DC
  Administered 2019-04-01: 2 g via INTRAVENOUS
  Filled 2019-04-01 (×3): qty 20

## 2019-04-01 MED ORDER — ACETAMINOPHEN 325 MG PO TABS
650.0000 mg | ORAL_TABLET | ORAL | Status: DC | PRN
  Administered 2019-04-01: 19:00:00 650 mg via ORAL
  Filled 2019-04-01: qty 2

## 2019-04-01 MED ORDER — SODIUM CHLORIDE 0.9 % IV SOLN
INTRAVENOUS | Status: DC
  Administered 2019-04-01 – 2019-04-02 (×3): via INTRAVENOUS

## 2019-04-01 NOTE — MAU Note (Signed)
Marissa Mitchell is a 28 y.o. at [redacted]w[redacted]d here in MAU reporting: states she has a hx of UTI. States she has been having increased urgency and burning. All these symptoms started Thursday evening. States she took azo and started to feel better but now it is worse. States she is having back pain and abdominal cramping on the right side. States [place] the beach and was stung by a jellyfish. +FM, no bleeding, no LOF.  Onset of complaint: Thursday evening  Pain score: back 6/10, abdomen 6/10  Vitals:   04/01/19 0800  BP: 97/62  Pulse: 84  Resp: 18  Temp: 98.2 F (36.8 C)  SpO2: 98%     FHT: +FM  Lab orders placed from triage: UA

## 2019-04-01 NOTE — MAU Provider Note (Signed)
Chief Complaint:  Dysuria, Abdominal Pain, and Back Pain   First Provider Initiated Contact with Patient 04/01/19 [phone]     HPI: Marissa Mitchell is a 28 y.o. G2P0100 at [redacted]w[redacted]d who presents to maternity admissions reporting dysuria & flank pain. Symptoms started Thursday. Reports history of yearly UTIs; last one was in December. States this started out feeling just like her UTIs in the past but this morning started having low back pain & right flank pain. Reports dysuria, frequency, & urgency. Denies fever/chills, n/v, or hematuria.  Denies contractions, leakage of fluid or vaginal bleeding. Good fetal movement.  Location: right flank Quality: aching Severity: 7/10 in pain scale Duration: 1 day Timing: intermittent Modifying factors: none Associated signs and symptoms: dysuria  Past Medical History:  Diagnosis Date  . Anxiety    panick attacks  . POTS (postural orthostatic tachycardia syndrome)    OB History  Gravida Para Term Preterm AB Living  2 1   1       SAB TAB Ectopic Multiple Live Births        0 0    # Outcome Date GA Lbr Len/2nd Weight Sex Delivery Anes PTL Lv  2 Current           1 Preterm 07/16/18 [redacted]w[redacted]d 02:55 / 00:27 2126 g M Vag-Spont EPI  FD   Past Surgical History:  Procedure Laterality Date  . BREAST ENHANCEMENT SURGERY    . COSMETIC SURGERY    . RHINOPLASTY     Family History  Problem Relation Age of Onset  . Epilepsy Father   . Lung cancer Maternal Grandfather   . Lung cancer Paternal Grandfather    Social History   Tobacco Use  . Smoking status: Current Some Day Smoker    Packs/day: 0.25    Types: Cigarettes  . Smokeless tobacco: Never Used  . Tobacco comment: 1 cig per week  Substance Use Topics  . Alcohol use: Not Currently    Comment: occasional drinker  . Drug use: No   Allergies  Allergen Reactions  . Citalopram Hydrobromide Hives  . Percocet [Oxycodone-Acetaminophen] Hives   Medications Prior to Admission  Medication Sig Dispense  Refill Last Dose  . ALPRAZolam (XANAX) 1 MG tablet Take 0.5 mg by mouth daily.    04/01/2019 at Unknown time  . atenolol (TENORMIN) 25 MG tablet Take 25 mg by mouth daily.    03/31/2019 at 2100  . calcium carbonate (TUMS - DOSED IN MG ELEMENTAL CALCIUM) 500 MG chewable tablet Chew 1 tablet by mouth as needed for indigestion or heartburn.   03/31/2019 at Unknown time  . folic acid (FOLVITE) 1 MG tablet Take 1 mg by mouth daily.    03/31/2019 at Unknown time  . montelukast (SINGULAIR) 10 MG tablet Take 10 mg by mouth at bedtime.    03/31/2019 at Unknown time  . ondansetron (ZOFRAN) 4 MG tablet Take 4 mg by mouth every 8 (eight) hours as needed for nausea.    unk  . Prenatal MV-Min-FA-Omega-3 (PRENATAL GUMMIES/DHA & FA) 0.4-32.5 MG CHEW Chew 2 tablets by mouth daily.   04/01/2019 at Unknown time  . ibuprofen (ADVIL,MOTRIN) 600 MG tablet Take 1 tablet (600 mg total) by mouth every 6 (six) hours. (Patient not taking: Reported on 04/01/2019) 30 tablet 0 Not Taking at Unknown time    I have reviewed patient's Past Medical Hx, Surgical Hx, Family Hx, Social Hx, medications and allergies.   ROS:  Review of Systems  Constitutional: Negative.  Gastrointestinal: Positive for abdominal pain. Negative for diarrhea, nausea and vomiting.  Genitourinary: Positive for dysuria, flank pain, frequency and urgency. Negative for hematuria, pelvic pain, vaginal bleeding and vaginal discharge.  Musculoskeletal: Positive for back pain.    Physical Exam   Patient Vitals for the past 24 hrs:  BP Temp Temp src Pulse Resp SpO2 Height Weight  04/01/19 1602 (!) 98/50 98.4 F (36.9 C) Oral 77 19 100 % - -  04/01/19 1210 (!) 107/57 98 F (36.7 C) Oral 72 18 100 % - -  04/01/19 1100 - - - - - 99 % - -  04/01/19 0800 97/62 98.2 F (36.8 C) Oral 84 18 98 % - -  04/01/19 0754 - - - - - - 5\' 4"  (1.626 m) 59.8 kg    Constitutional: Well-developed, well-nourished female in no acute distress.  Cardiovascular: normal rate & rhythm, no  murmur Respiratory: normal effort, lung sounds clear throughout GI: Right CVAT. Abd soft, non-tender, gravid appropriate for gestational age. Pos BS x 4 MS: Extremities nontender, no edema, normal ROM Neurologic: Alert and oriented x 4.  GU:      Pelvic: NEFG, physiologic discharge, no blood, cervix clean.   Dilation: Closed Effacement (%): Thick Cervical Position: Middle Exam by:: Judeth HornErin Lawrence NP   NST:  Baseline: 145 bpm, Variability: Good {> 6 bpm), Accelerations: Non-reactive but appropriate for gestational age and Decelerations: Absent   Labs: Results for orders placed or performed during the hospital encounter of 04/01/19 (from the past 24 hour(s))  Urinalysis, Routine w reflex microscopic     Status: Abnormal   Collection Time: 04/01/19  8:13 AM  Result Value Ref Range   Color, Urine YELLOW YELLOW   APPearance CLOUDY (A) CLEAR   Specific Gravity, Urine 1.004 (L) 1.005 - 1.030   pH 7.0 5.0 - 8.0   Glucose, UA NEGATIVE NEGATIVE mg/dL   Hgb urine dipstick MODERATE (A) NEGATIVE   Bilirubin Urine NEGATIVE NEGATIVE   Ketones, ur NEGATIVE NEGATIVE mg/dL   Protein, ur NEGATIVE NEGATIVE mg/dL   Nitrite NEGATIVE NEGATIVE   Leukocytes,Ua LARGE (A) NEGATIVE   RBC / HPF 11-20 0 - 5 RBC/hpf   WBC, UA >50 (H) 0 - 5 WBC/hpf   Bacteria, UA RARE (A) NONE SEEN   Squamous Epithelial / LPF 6-10 0 - 5   WBC Clumps PRESENT    Budding Yeast PRESENT    Non Squamous Epithelial 0-5 (A) NONE SEEN  CBC with Differential/Platelet     Status: Abnormal   Collection Time: 04/01/19  9:50 AM  Result Value Ref Range   WBC 12.1 (H) 4.0 - 10.5 K/uL   RBC 3.50 (L) 3.87 - 5.11 MIL/uL   Hemoglobin 10.5 (L) 12.0 - 15.0 g/dL   HCT 16.132.0 (L) 09.636.0 - 04.546.0 %   MCV 91.4 80.0 - 100.0 fL   MCH 30.0 26.0 - 34.0 pg   MCHC 32.8 30.0 - 36.0 g/dL   RDW 40.913.5 81.111.5 - 91.415.5 %   Platelets 226 150 - 400 K/uL   nRBC 0.0 0.0 - 0.2 %   Neutrophils Relative % 73 %   Neutro Abs 8.9 (H) 1.7 - 7.7 K/uL   Lymphocytes Relative  17 %   Lymphs Abs 2.1 0.7 - 4.0 K/uL   Monocytes Relative 7 %   Monocytes Absolute 0.8 0.1 - 1.0 K/uL   Eosinophils Relative 1 %   Eosinophils Absolute 0.1 0.0 - 0.5 K/uL   Basophils Relative 0 %   Basophils  Absolute 0.0 0.0 - 0.1 K/uL   Immature Granulocytes 2 %   Abs Immature Granulocytes 0.19 (H) 0.00 - 0.07 K/uL  Comprehensive metabolic panel     Status: Abnormal   Collection Time: 04/01/19  9:50 AM  Result Value Ref Range   Sodium 136 135 - 145 mmol/L   Potassium 3.7 3.5 - 5.1 mmol/L   Chloride 103 98 - 111 mmol/L   CO2 24 22 - 32 mmol/L   Glucose, Bld 82 70 - 99 mg/dL   BUN 6 6 - 20 mg/dL   Creatinine, Ser 6.81 0.44 - 1.00 mg/dL   Calcium 8.8 (L) 8.9 - 10.3 mg/dL   Total Protein 6.1 (L) 6.5 - 8.1 g/dL   Albumin 3.0 (L) 3.5 - 5.0 g/dL   AST 23 15 - 41 U/L   ALT 15 0 - 44 U/L   Alkaline Phosphatase 66 38 - 126 U/L   Total Bilirubin 0.2 (L) 0.3 - 1.2 mg/dL   GFR calc non Af Amer >60 >60 mL/min   GFR calc Af Amer >60 >60 mL/min   Anion gap 9 5 - 15  SARS Coronavirus 2 [facility] order, Performed in [facility] hospital lab) Nasopharyngeal Nasopharyngeal Swab     Status: None   Collection Time: 04/01/19 11:15 AM   Specimen: Nasopharyngeal Swab  Result Value Ref Range   SARS Coronavirus 2 NEGATIVE NEGATIVE  Type and screen [facility]     Status: None   Collection Time: 04/01/19 11:35 AM  Result Value Ref Range   ABO/RH(D) O POS    Antibody Screen NEG    Sample Expiration      04/04/2019,2359 Performed at [facility], [address], [place], [place] [address]   ABO/Rh     Status: None   Collection Time: 04/01/19 11:35 AM  Result Value Ref Range   ABO/RH(D)      O POS Performed at [facility], [address], [place], [place] [address]     Imaging:  US Renal  Result Date: 04/01/2019 CLINICAL DATA:  Right flank pain in a [redacted] week pregnant patient. EXAM: RENAL / URINARY TRACT ULTRASOUND COMPLETE COMPARISON:  None. FINDINGS: Right  Kidney: Renal measurements: 11.7 x 5.7 x 5.3 cm = volume: 356 mL. Moderate hydronephrosis. Echogenicity within normal limits. No mass visualized. Left Kidney: Renal measurements: 9.8 x 5.1 x 5.8 cm = volume: 286 mL. Echogenicity within normal limits. Mild hydronephrosis. No hydronephrosis visualized. Bladder: Appears normal for degree of bladder distention. IMPRESSION: Moderate right and mild left hydronephrosis. The exam is otherwise negative. Electronically Signed   By: Drusilla Kanner M.D.   On: 04/01/2019 10:55    MAU Course: Orders Placed This Encounter  Procedures  . Culture, OB Urine  . SARS Coronavirus 2 [facility] order, Performed in [facility] hospital lab) Nasopharyngeal Nasopharyngeal Swab  . US RENAL  . Urinalysis, Routine w reflex microscopic  . CBC with Differential/Platelet  . Comprehensive metabolic panel  . OB RESULTS CONSOLE RPR  . OB RESULTS CONSOLE HIV antibody  . OB RESULTS CONSOLE Rubella Antibody  . OB RESULTS CONSOLE Hepatitis B surface antigen  . Diet regular Room service appropriate? Yes; Fluid consistency: Thin  . Notify physician (specify)  . Vital signs  . Defer vaginal exam for vaginal bleeding or PROM <37 weeks  . Initiate Oral Care Protocol  . Initiate Carrier Fluid Protocol  . SCDs  . Fetal monitoring every shift x 30 minutes  .  Activity as tolerated  . Full code  . Type and screen [facility]  . ABO/Rh  . Admit to Inpatient (patient's expected length of stay will be greater than 2 midnights or inpatient only procedure)   Meds ordered this encounter  Medications  . acetaminophen (TYLENOL) tablet 1,000 mg  . 0.9 %  sodium chloride infusion  . acetaminophen (TYLENOL) tablet 650 mg  . zolpidem (AMBIEN) tablet 5 mg  . docusate sodium (COLACE) capsule 100 mg  . calcium carbonate (TUMS - dosed in mg elemental calcium) chewable tablet 400 mg of elemental calcium  . prenatal multivitamin tablet 1 tablet  . cefTRIAXone (ROCEPHIN) 2 g  in sodium chloride 0.9 % 100 mL IVPB    Order Specific Question:   Antibiotic Indication:    Answer:   UTI    Order Specific Question:   Other Indication:    Answer:   pyelonephritis in pregnancy    MDM: Category 1 tracing. No ctx on monitor. Cervix closed/thick  U/a with large leuks & hemoglobin - urine culture sent. On exam + right CVAT. Pt afebrile. Renal ultrasound shows no obvious signs of stone. Will admit for pyelonephritis. C/w Dr. Vergie LivingPickens regarding care of this patient.   Assessment: 1. Pyelonephritis affecting pregnancy in third trimester   2. Acute right flank pain   3. [redacted] weeks gestation of pregnancy     [facility] Ceftriaxone 2 gm Q 24 hrs Urine culture pending NST q shift Notified Dr. Henderson Cloudomblin of patient presentation & admission -- agrees with plan   5:27pm  Agree with above. Patient now feels much better. Flank pain gone.  Today's Vitals   04/01/19 1210 04/01/19 1215 04/01/19 1500 04/01/19 1602  BP: (!) 107/57   (!) 98/50  Pulse: 72   77  Resp: 18   19  Temp: 98 F (36.7 C)   98.4 F (36.9 C)  TempSrc: Oral   Oral  SpO2: 100%   100%  Weight:      Height:      PainSc:  0-No pain 0-No pain    Body mass index is 22.64 kg/m.  Back without CVAT  FHT cat one  Urine C&S pending  A/P: Pyelonephritis @ 28 3/7 weeks responding well to IV fluids and Atb.         D/W patient-probable discharge in am Harold Hedgeomblin, Fergus Throne, MD 04/01/2019 5:27 PM

## 2019-04-01 NOTE — MAU Provider Note (Signed)
Chief Complaint:  Dysuria, Abdominal Pain, and Back Pain   First Provider Initiated Contact with Patient 04/01/19 [phone]     HPI: Marissa Mitchell is a 28 y.o. G2P0100 at [redacted]w[redacted]d who presents to maternity admissions reporting dysuria & flank pain. Symptoms started Thursday. Reports history of yearly UTIs; last one was in December. States this started out feeling just like her UTIs in the past but this morning started having low back pain & right flank pain. Reports dysuria, frequency, & urgency. Denies fever/chills, n/v, or hematuria.  Denies contractions, leakage of fluid or vaginal bleeding. Good fetal movement.  Location: right flank Quality: aching Severity: 7/10 in pain scale Duration: 1 day Timing: intermittent Modifying factors: none Associated signs and symptoms: dysuria  Past Medical History:  Diagnosis Date  . Anxiety    panick attacks  . POTS (postural orthostatic tachycardia syndrome)    OB History  Gravida Para Term Preterm AB Living  2 1   1       SAB TAB Ectopic Multiple Live Births        0 0    # Outcome Date GA Lbr Len/2nd Weight Sex Delivery Anes PTL Lv  2 Current           1 Preterm 07/16/18 [redacted]w[redacted]d 02:55 / 00:27 2126 g M Vag-Spont EPI  FD   Past Surgical History:  Procedure Laterality Date  . BREAST ENHANCEMENT SURGERY    . COSMETIC SURGERY    . RHINOPLASTY     Family History  Problem Relation Age of Onset  . Epilepsy Father   . Lung cancer Maternal Grandfather   . Lung cancer Paternal Grandfather    Social History   Tobacco Use  . Smoking status: Current Some Day Smoker    Packs/day: 0.25    Types: Cigarettes  . Smokeless tobacco: Never Used  . Tobacco comment: 1 cig per week  Substance Use Topics  . Alcohol use: Not Currently    Comment: occasional drinker  . Drug use: No   Allergies  Allergen Reactions  . Citalopram Hydrobromide Hives  . Percocet [Oxycodone-Acetaminophen] Hives   Medications Prior to Admission  Medication Sig Dispense  Refill Last Dose  . ALPRAZolam (XANAX) 1 MG tablet alprazolam 1 mg tablet  TAKE 1/2 TO ONE TABLET 3 TIMES A DAY AS NEEDED FOR ANXIETY OR INSOMNIA.     Marland Kitchen atenolol (TENORMIN) 25 MG tablet Take 25 mg by mouth daily.      . folic acid (FOLVITE) 1 MG tablet Take 1 mg by mouth daily.      Marland Kitchen ibuprofen (ADVIL,MOTRIN) 600 MG tablet Take 1 tablet (600 mg total) by mouth every 6 (six) hours. 30 tablet 0   . montelukast (SINGULAIR) 10 MG tablet Take 10 mg by mouth at bedtime.      . ondansetron (ZOFRAN) 4 MG tablet Take one tablet 3 times a day as needed for nausea       I have reviewed patient's Past Medical Hx, Surgical Hx, Family Hx, Social Hx, medications and allergies.   ROS:  Review of Systems  Constitutional: Negative.   Gastrointestinal: Positive for abdominal pain. Negative for diarrhea, nausea and vomiting.  Genitourinary: Positive for dysuria, flank pain, frequency and urgency. Negative for hematuria, pelvic pain, vaginal bleeding and vaginal discharge.  Musculoskeletal: Positive for back pain.    Physical Exam   Patient Vitals for the past 24 hrs:  BP Temp Temp src Pulse Resp SpO2 Height Weight  04/01/19 0800 97/62 98.2  F (36.8 C) Oral 84 18 98 % - -  04/01/19 0754 - - - - - - 5\' 4"  (1.626 m) 59.8 kg    Constitutional: Well-developed, well-nourished female in no acute distress.  Cardiovascular: normal rate & rhythm, no murmur Respiratory: normal effort, lung sounds clear throughout GI: Right CVAT. Abd soft, non-tender, gravid appropriate for gestational age. Pos BS x 4 MS: Extremities nontender, no edema, normal ROM Neurologic: Alert and oriented x 4.  GU:      Pelvic: NEFG, physiologic discharge, no blood, cervix clean.   Dilation: Closed Effacement (%): Thick Cervical Position: Middle Exam by:: Judeth Horn NP   NST:  Baseline: 145 bpm, Variability: Good {> 6 bpm), Accelerations: Non-reactive but appropriate for gestational age and Decelerations: Absent   Labs: Results  for orders placed or performed during the hospital encounter of 04/01/19 (from the past 24 hour(s))  Urinalysis, Routine w reflex microscopic     Status: Abnormal   Collection Time: 04/01/19  8:13 AM  Result Value Ref Range   Color, Urine YELLOW YELLOW   APPearance CLOUDY (A) CLEAR   Specific Gravity, Urine 1.004 (L) 1.005 - 1.030   pH 7.0 5.0 - 8.0   Glucose, UA NEGATIVE NEGATIVE mg/dL   Hgb urine dipstick MODERATE (A) NEGATIVE   Bilirubin Urine NEGATIVE NEGATIVE   Ketones, ur NEGATIVE NEGATIVE mg/dL   Protein, ur NEGATIVE NEGATIVE mg/dL   Nitrite NEGATIVE NEGATIVE   Leukocytes,Ua LARGE (A) NEGATIVE   RBC / HPF 11-20 0 - 5 RBC/hpf   WBC, UA >50 (H) 0 - 5 WBC/hpf   Bacteria, UA RARE (A) NONE SEEN   Squamous Epithelial / LPF 6-10 0 - 5   WBC Clumps PRESENT    Budding Yeast PRESENT    Non Squamous Epithelial 0-5 (A) NONE SEEN  CBC with Differential/Platelet     Status: Abnormal   Collection Time: 04/01/19  9:50 AM  Result Value Ref Range   WBC 12.1 (H) 4.0 - 10.5 K/uL   RBC 3.50 (L) 3.87 - 5.11 MIL/uL   Hemoglobin 10.5 (L) 12.0 - 15.0 g/dL   HCT 38.4 (L) 66.5 - 99.3 %   MCV 91.4 80.0 - 100.0 fL   MCH 30.0 26.0 - 34.0 pg   MCHC 32.8 30.0 - 36.0 g/dL   RDW 57.0 17.7 - 93.9 %   Platelets 226 150 - 400 K/uL   nRBC 0.0 0.0 - 0.2 %   Neutrophils Relative % 73 %   Neutro Abs 8.9 (H) 1.7 - 7.7 K/uL   Lymphocytes Relative 17 %   Lymphs Abs 2.1 0.7 - 4.0 K/uL   Monocytes Relative 7 %   Monocytes Absolute 0.8 0.1 - 1.0 K/uL   Eosinophils Relative 1 %   Eosinophils Absolute 0.1 0.0 - 0.5 K/uL   Basophils Relative 0 %   Basophils Absolute 0.0 0.0 - 0.1 K/uL   Immature Granulocytes 2 %   Abs Immature Granulocytes 0.19 (H) 0.00 - 0.07 K/uL  Comprehensive metabolic panel     Status: Abnormal   Collection Time: 04/01/19  9:50 AM  Result Value Ref Range   Sodium 136 135 - 145 mmol/L   Potassium 3.7 3.5 - 5.1 mmol/L   Chloride 103 98 - 111 mmol/L   CO2 24 22 - 32 mmol/L   Glucose,  Bld 82 70 - 99 mg/dL   BUN 6 6 - 20 mg/dL   Creatinine, Ser 0.30 0.44 - 1.00 mg/dL   Calcium 8.8 (L) 8.9 -  10.3 mg/dL   Total Protein 6.1 (L) 6.5 - 8.1 g/dL   Albumin 3.0 (L) 3.5 - 5.0 g/dL   AST 23 15 - 41 U/L   ALT 15 0 - 44 U/L   Alkaline Phosphatase 66 38 - 126 U/L   Total Bilirubin 0.2 (L) 0.3 - 1.2 mg/dL   GFR calc non Af Amer >60 >60 mL/min   GFR calc Af Amer >60 >60 mL/min   Anion gap 9 5 - 15    Imaging:  [place] Renal  Result Date: 04/01/2019 CLINICAL DATA:  Right flank pain in a [redacted] week pregnant patient. EXAM: RENAL / URINARY TRACT ULTRASOUND COMPLETE COMPARISON:  None. FINDINGS: Right Kidney: Renal measurements: 11.7 x 5.7 x 5.3 cm = volume: 356 mL. Moderate hydronephrosis. Echogenicity within normal limits. No mass visualized. Left Kidney: Renal measurements: 9.8 x 5.1 x 5.8 cm = volume: 286 mL. Echogenicity within normal limits. Mild hydronephrosis. No hydronephrosis visualized. Bladder: Appears normal for degree of bladder distention. IMPRESSION: Moderate right and mild left hydronephrosis. The exam is otherwise negative. Electronically Signed   By: Drusilla Kannerhomas  Dalessio M.D.   On: 04/01/2019 10:55    MAU Course: Orders Placed This Encounter  Procedures  . Culture, OB Urine  . SARS Coronavirus 2 [facility](Hospital order, [facility]) Nasopharyngeal Nasopharyngeal Swab  . US RENAL  . Urinalysis, Routine w reflex microscopic  . CBC with Differential/Platelet  . Comprehensive metabolic panel  . Diet regular Room service appropriate? Yes; Fluid consistency: Thin  . Notify physician (specify)  . Vital signs  . Defer vaginal exam for vaginal bleeding or PROM <37 weeks  . Initiate Oral Care Protocol  . Initiate Carrier Fluid Protocol  . SCDs  . Fetal monitoring every shift x 30 minutes  . Activity as tolerated  . Full code  . Type and screen [facility]  . Admit to Inpatient (patient's expected length of stay will be greater than 2 midnights or  inpatient only procedure)   Meds ordered this encounter  Medications  . acetaminophen (TYLENOL) tablet 1,000 mg  . 0.9 %  sodium chloride infusion  . acetaminophen (TYLENOL) tablet 650 mg  . zolpidem (AMBIEN) tablet 5 mg  . docusate sodium (COLACE) capsule 100 mg  . calcium carbonate (TUMS - dosed in mg elemental calcium) chewable tablet 400 mg of elemental calcium  . prenatal multivitamin tablet 1 tablet  . cefTRIAXone (ROCEPHIN) 2 g in sodium chloride 0.9 % 100 mL IVPB    Order Specific Question:   Antibiotic Indication:    Answer:   UTI    Order Specific Question:   Other Indication:    Answer:   pyelonephritis in pregnancy    MDM: Category 1 tracing. No ctx on monitor. Cervix closed/thick  U/a with large leuks & hemoglobin - urine culture sent. On exam + right CVAT. Pt afebrile. Renal ultrasound shows no obvious signs of stone. Will admit for pyelonephritis. C/w Dr. Vergie LivingPickens regarding care of this patient.   Assessment: 1. Pyelonephritis affecting pregnancy in third trimester   2. Acute right flank pain   3. [redacted] weeks gestation of pregnancy     [facility] Ceftriaxone 2 gm Q 24 hrs Urine culture pending NST q shift Notified Dr. Henderson Cloudomblin of patient presentation & admission -- agrees with plan  Judeth HornLawrence, Khloie Hamada, NP 04/01/2019 11:20 AM

## 2019-04-01 NOTE — MAU Note (Signed)
covid swab collected

## 2019-04-02 MED ORDER — CEPHALEXIN 500 MG PO CAPS: 500.0000 mg | ORAL_CAPSULE | Freq: Four times a day (QID) | ORAL | 0 refills | Status: DC

## 2019-04-02 NOTE — Progress Notes (Signed)
28 4/7  Feels good, voiding well, no burning  Today's Vitals   04/01/19 1926 04/01/19 2232 04/01/19 2248 04/02/19 0304  BP: (!) 90/47  99/64   Pulse: 68  65   Resp: 18  18   Temp: 98.3 F (36.8 C)  98.2 F (36.8 C)   TempSrc: Oral  Oral   SpO2: 100%  99%   Weight:      Height:      PainSc:  0-No pain  Asleep   Body mass index is 22.64 kg/m.   FHT cat one  Urine C&S pending  A/P: Pyelonephritis responding well          D/C home         Cephalexin          FU office 1-2 weeks

## 2019-04-02 NOTE — Discharge Summary (Signed)
Physician Discharge Summary  Patient ID: Marissa FurlongHannah B Mitchell MRN: [id number] DOB/AGE: [birth year] 30 y.o.  Admit date: 04/01/2019 Discharge date: 04/02/2019  Admission Diagnoses:pyelonephritis in third trimester  Discharge Diagnoses:  Active Problems:   Pyelonephritis affecting pregnancy in third trimester   Discharged Condition: good  Hospital Course:  IV fluids and cephtriaxone started with prompt resolution of flank pain. U/S negative for kidney stone or ureteral obstruction. Urine C&S pending. Voiding well without pain. FHT cat one.  Consults: None  Significant Diagnostic Studies: labs:  Results for orders placed or performed during the hospital encounter of 04/01/19 (from the past 48 hour(s))  Urinalysis, Routine w reflex microscopic     Status: Abnormal   Collection Time: 04/01/19  8:13 AM  Result Value Ref Range   Color, Urine YELLOW YELLOW   APPearance CLOUDY (A) CLEAR   Specific Gravity, Urine 1.004 (L) 1.005 - 1.030   pH 7.0 5.0 - 8.0   Glucose, UA NEGATIVE NEGATIVE mg/dL   Hgb urine dipstick MODERATE (A) NEGATIVE   Bilirubin Urine NEGATIVE NEGATIVE   Ketones, ur NEGATIVE NEGATIVE mg/dL   Protein, ur NEGATIVE NEGATIVE mg/dL   Nitrite NEGATIVE NEGATIVE   Leukocytes,Ua LARGE (A) NEGATIVE   RBC / HPF 11-20 0 - 5 RBC/hpf   WBC, UA >50 (H) 0 - 5 WBC/hpf   Bacteria, UA RARE (A) NONE SEEN   Squamous Epithelial / LPF 6-10 0 - 5   WBC Clumps PRESENT    Budding Yeast PRESENT    Non Squamous Epithelial 0-5 (A) NONE SEEN    Comment: Performed at [facility], [address] [place], [place] [address]  CBC with Differential/Platelet     Status: Abnormal   Collection Time: 04/01/19  9:50 AM  Result Value Ref Range   WBC 12.1 (H) 4.0 - 10.5 K/uL   RBC 3.50 (L) 3.87 - 5.11 MIL/uL   Hemoglobin 10.5 (L) 12.0 - 15.0 g/dL   HCT 82.932.0 (L) 56.236.0 - 13.046.0 %   MCV 91.4 80.0 - 100.0 fL   MCH 30.0 26.0 - 34.0 pg   MCHC 32.8 30.0 - 36.0 g/dL   RDW 86.513.5 78.411.5 - 69.615.5 %   Platelets 226  150 - 400 K/uL   nRBC 0.0 0.0 - 0.2 %   Neutrophils Relative % 73 %   Neutro Abs 8.9 (H) 1.7 - 7.7 K/uL   Lymphocytes Relative 17 %   Lymphs Abs 2.1 0.7 - 4.0 K/uL   Monocytes Relative 7 %   Monocytes Absolute 0.8 0.1 - 1.0 K/uL   Eosinophils Relative 1 %   Eosinophils Absolute 0.1 0.0 - 0.5 K/uL   Basophils Relative 0 %   Basophils Absolute 0.0 0.0 - 0.1 K/uL   Immature Granulocytes 2 %   Abs Immature Granulocytes 0.19 (H) 0.00 - 0.07 K/uL    Comment: Performed at [facility], [address] [place], [place] [address]  Comprehensive metabolic panel     Status: Abnormal   Collection Time: 04/01/19  9:50 AM  Result Value Ref Range   Sodium 136 135 - 145 mmol/L   Potassium 3.7 3.5 - 5.1 mmol/L   Chloride 103 98 - 111 mmol/L   CO2 24 22 - 32 mmol/L   Glucose, Bld 82 70 - 99 mg/dL   BUN 6 6 - 20 mg/dL   Creatinine, Ser 4.130.60 0.44 - 1.00 mg/dL   Calcium 8.8 (L) 8.9 - 10.3 mg/dL   Total Protein 6.1 (L) 6.5 - 8.1  g/dL   Albumin 3.0 (L) 3.5 - 5.0 g/dL   AST 23 15 - 41 U/L   ALT 15 0 - 44 U/L   Alkaline Phosphatase 66 38 - 126 U/L   Total Bilirubin 0.2 (L) 0.3 - 1.2 mg/dL   GFR calc non Af Amer >60 >60 mL/min   GFR calc Af Amer >60 >60 mL/min   Anion gap 9 5 - 15    Comment: Performed at [facility], [address], [place], [place] [address]  SARS Coronavirus 2 [facility] order, Performed in [facility] hospital lab) Nasopharyngeal Nasopharyngeal Swab     Status: None   Collection Time: 04/01/19 11:15 AM   Specimen: Nasopharyngeal Swab  Result Value Ref Range   SARS Coronavirus 2 NEGATIVE NEGATIVE    Comment: (NOTE) If result is NEGATIVE SARS-CoV-2 target nucleic acids are NOT DETECTED. The SARS-CoV-2 RNA is generally detectable in upper and lower  respiratory specimens during the acute phase of infection. The lowest  concentration of SARS-CoV-2 viral copies this assay can detect is 250  copies / mL. A negative result does not preclude SARS-CoV-2 infection  and  should not be used as the sole basis for treatment or other  patient management decisions.  A negative result may occur with  improper specimen collection / handling, submission of specimen other  than nasopharyngeal swab, presence of viral mutation(s) within the  areas targeted by this assay, and inadequate number of viral copies  (<250 copies / mL). A negative result must be combined with clinical  observations, patient history, and epidemiological information. If result is POSITIVE SARS-CoV-2 target nucleic acids are DETECTED. The SARS-CoV-2 RNA is generally detectable in upper and lower  respiratory specimens dur ing the acute phase of infection.  Positive  results are indicative of active infection with SARS-CoV-2.  Clinical  correlation with patient history and other diagnostic information is  necessary to determine patient infection status.  Positive results do  not rule out bacterial infection or co-infection with other viruses. If result is PRESUMPTIVE POSTIVE SARS-CoV-2 nucleic acids MAY BE PRESENT.   A presumptive positive result was obtained on the submitted specimen  and confirmed on repeat testing.  While 2019 novel coronavirus  (SARS-CoV-2) nucleic acids may be present in the submitted sample  additional confirmatory testing may be necessary for epidemiological  and / or clinical management purposes  to differentiate between  SARS-CoV-2 and other Sarbecovirus currently known to infect humans.  If clinically indicated additional testing with an alternate test  methodology [phone]) is advised. The SARS-CoV-2 RNA is generally  detectable in upper and lower respiratory sp ecimens during the acute  phase of infection. The expected result is Negative. Fact Sheet for Patients:  BoilerBrush.com.cy Fact Sheet for Healthcare Providers: https://pope.com/ This test is not yet approved or cleared by the [place] FDA and has been  authorized for detection and/or diagnosis of SARS-CoV-2 by FDA under an Emergency Use Authorization (EUA).  This EUA will remain in effect (meaning this test can be used) for the duration of the COVID-19 declaration under Section 564(b)(1) of the Act, 21 U.S.C. section 360bbb-3(b)(1), unless the authorization is terminated or revoked sooner. Performed at [facility], [address], [place], [place] [address]   Type and screen [facility]     Status: None   Collection Time: 04/01/19 11:35 AM  Result Value Ref Range   ABO/RH(D) O POS    Antibody Screen NEG  Sample Expiration      04/04/2019,2359 Performed at [facility], [place] [address], [place], [place] [address]   ABO/Rh     Status: None   Collection Time: 04/01/19 11:35 AM  Result Value Ref Range   ABO/RH(D)      O POS Performed at [place] [address], [place], [place] [address]     Treatments: IV hydration and antibiotics: ceftriaxone  Discharge Exam: Blood pressure 99/64, pulse 65, temperature 98.2 F (36.8 C), temperature source Oral, resp. rate 18, height 5\' 4"  (1.626 m), weight 59.8 kg, SpO2 99 %. General appearance: alert, cooperative and no distress Back: no tenderness to percussion or palpation, symmetric, no curvature. ROM normal. No CVA tenderness.  Uterus soft, NT  Disposition: Discharge disposition: 01-Home or Self Care       Discharge Instructions    Discharge patient   Complete by: As directed    Discharge disposition: 01-Home or Self Care   Discharge patient date: 04/02/2019     Allergies as of 04/02/2019      Reactions   Citalopram Hydrobromide Hives   Percocet [oxycodone-acetaminophen] Hives      Medication List    STOP taking these medications   ibuprofen 600 MG tablet Commonly known as: ADVIL     TAKE these medications   ALPRAZolam 1 MG tablet Commonly known as: XANAX Take 0.5 mg by mouth daily.   atenolol 25 MG tablet Commonly known  as: TENORMIN Take 25 mg by mouth daily.   calcium carbonate 500 MG chewable tablet Commonly known as: TUMS - dosed in mg elemental calcium Chew 1 tablet by mouth as needed for indigestion or heartburn.   cephALEXin 500 MG capsule Commonly known as: Keflex Take 1 capsule (500 mg total) by mouth 4 (four) times daily.   folic acid 1 MG tablet Commonly known as: FOLVITE Take 1 mg by mouth daily.   montelukast 10 MG tablet Commonly known as: SINGULAIR Take 10 mg by mouth at bedtime.   ondansetron 4 MG tablet Commonly known as: ZOFRAN Take 4 mg by mouth every 8 (eight) hours as needed for nausea.   Prenatal Gummies/DHA & FA 0.4-32.5 MG Chew Chew 2 tablets by mouth daily.        Signed: Shon Millet II 04/02/2019, 7:55 AM

## 2019-04-03 LAB — CULTURE, OB URINE: Culture: 80000 — AB

## 2019-05-22 ENCOUNTER — Other Ambulatory Visit (HOSPITAL_COMMUNITY): Payer: Self-pay | Admitting: Obstetrics & Gynecology

## 2019-05-22 DIAGNOSIS — Z363 Encounter for antenatal screening for malformations: Secondary | ICD-10-CM

## 2019-05-22 DIAGNOSIS — Z8759 Personal history of other complications of pregnancy, childbirth and the puerperium: Secondary | ICD-10-CM

## 2019-05-22 DIAGNOSIS — Z3A35 35 weeks gestation of pregnancy: Secondary | ICD-10-CM

## 2019-05-22 DIAGNOSIS — O09299 Supervision of pregnancy with other poor reproductive or obstetric history, unspecified trimester: Secondary | ICD-10-CM

## 2019-05-23 ENCOUNTER — Ambulatory Visit (HOSPITAL_COMMUNITY): Payer: Self-pay

## 2019-05-23 ENCOUNTER — Ambulatory Visit (HOSPITAL_COMMUNITY)
Admission: RE | Admit: 2019-05-23 | Discharge: 2019-05-23 | Disposition: A | Discharge status: Home / Self Care | Payer: Self-pay | Source: Ambulatory Visit | Attending: Obstetrics and Gynecology | Admitting: Obstetrics and Gynecology

## 2019-05-23 ENCOUNTER — Other Ambulatory Visit (HOSPITAL_COMMUNITY): Payer: Self-pay | Admitting: Obstetrics & Gynecology

## 2019-05-23 ENCOUNTER — Other Ambulatory Visit: Payer: Self-pay

## 2019-05-23 ENCOUNTER — Ambulatory Visit (HOSPITAL_COMMUNITY): Payer: Self-pay | Admitting: *Deleted

## 2019-05-23 ENCOUNTER — Ambulatory Visit (HOSPITAL_BASED_OUTPATIENT_CLINIC_OR_DEPARTMENT_OTHER): Payer: Self-pay | Admitting: *Deleted

## 2019-05-23 ENCOUNTER — Encounter (HOSPITAL_COMMUNITY): Payer: Self-pay

## 2019-05-23 VITALS — BP 113/72 | HR 93 | Temp 98.4°F

## 2019-05-23 DIAGNOSIS — O09299 Supervision of pregnancy with other poor reproductive or obstetric history, unspecified trimester: Secondary | ICD-10-CM

## 2019-05-23 DIAGNOSIS — Z8759 Personal history of other complications of pregnancy, childbirth and the puerperium: Secondary | ICD-10-CM | POA: Insufficient documentation

## 2019-05-23 DIAGNOSIS — Z3A35 35 weeks gestation of pregnancy: Secondary | ICD-10-CM

## 2019-05-23 DIAGNOSIS — Z363 Encounter for antenatal screening for malformations: Secondary | ICD-10-CM

## 2019-05-23 DIAGNOSIS — O0990 Supervision of high risk pregnancy, unspecified, unspecified trimester: Secondary | ICD-10-CM

## 2019-05-23 DIAGNOSIS — O365930 Maternal care for other known or suspected poor fetal growth, third trimester, not applicable or unspecified: Secondary | ICD-10-CM

## 2019-05-23 DIAGNOSIS — O0993 Supervision of high risk pregnancy, unspecified, third trimester: Secondary | ICD-10-CM

## 2019-05-23 DIAGNOSIS — O09293 Supervision of pregnancy with other poor reproductive or obstetric history, third trimester: Secondary | ICD-10-CM

## 2019-05-23 DIAGNOSIS — O364XX0 Maternal care for intrauterine death, not applicable or unspecified: Secondary | ICD-10-CM

## 2019-05-23 DIAGNOSIS — O2693 Pregnancy related conditions, unspecified, third trimester: Secondary | ICD-10-CM

## 2019-05-23 MED ORDER — BETAMETHASONE SOD PHOS & ACET 6 (3-3) MG/ML IJ SUSP
12.0000 mg | Freq: Once | INTRAMUSCULAR | Status: AC
  Administered 2019-05-23: 12 mg via INTRAMUSCULAR

## 2019-05-23 NOTE — Procedures (Signed)
Marissa Mitchell 03/25/1991 [redacted]w[redacted]d  Fetus A Non-Stress Test Interpretation for 05/23/19  Indication: Decreased Fetal Movement  Fetal Heart Rate A Mode: External Baseline Rate (A): 130 bpm Variability: Moderate Accelerations: 15 x 15  Uterine Activity Mode: Toco Contraction Frequency (min): none noted  Interpretation (Fetal Testing) Nonstress Test Interpretation: Reactive Comments: FHR tracing rev'd by Dr. Annamaria Boots

## 2019-05-24 ENCOUNTER — Other Ambulatory Visit (HOSPITAL_COMMUNITY): Payer: Self-pay | Attending: Obstetrics and Gynecology

## 2019-05-24 ENCOUNTER — Telehealth (HOSPITAL_COMMUNITY): Payer: Self-pay | Admitting: *Deleted

## 2019-05-24 ENCOUNTER — Encounter (HOSPITAL_COMMUNITY): Payer: Self-pay | Admitting: *Deleted

## 2019-05-24 NOTE — Progress Notes (Signed)
This patient was seen in consultation due to fetal growth restriction with a prior IUFD.  The patient reports that a fetal demise was found at 36 weeks and 5 days of her last pregnancy.  She reports that her prior pregnancy was essentially uncomplicated up until that point.  The birth weight of the demised fetus was 4 pounds 11 ounces.  Due to her history of a prior IUFD, the patient has been followed with serial growth ultrasounds in your office.  Fetal growth restriction was noted on a recent ultrasound exam.  She reports a history of POTS that is currently treated with atenolol 25 mg daily.  The patient denies any history of hypertension or diabetes.  On today's exam, the overall EFW measures 4 pounds 6 ounces (1st percentile for her gestational age).  There was normal amniotic fluid noted.  A biophysical profile performed today was 8 out of 8.  The patient also had a reactive nonstress test today. Doppler studies of the umbilical arteries performed due to fetal growth restriction showed a slightly elevated S/D ratio of 3.48.  There were no signs of absent or reversed end-diastolic flow.  Due to the history of a prior IUFD and fetal growth restriction noted today, at her current gestational age, I would recommend that the patient receive a complete course of antenatal corticosteroids followed by delivery after she has received the complete course of steroids.   The patient received the first dose of steroids in our office today.  She will go to your office tomorrow morning to receive the second dose of steroids.  Another NST should be performed in your office tomorrow.  The patient may then be admitted to the hospital tomorrow evening for cervical ripening.  The patient understands that there is a possibility that her baby may require a NICU admission for delivery at her current gestational age.  However, she is willing to accept the risk of a NICU admission in order to have a good outcome in this  pregnancy.  A total of 30 minutes was spent counseling and coordinating the care for this patient.  Greater than 50% of the time was spent in direct face-to-face contact.

## 2019-05-24 NOTE — Telephone Encounter (Signed)
Preadmission screen  

## 2019-05-25 ENCOUNTER — Inpatient Hospital Stay (HOSPITAL_COMMUNITY)
Admission: AD | Admit: 2019-05-25 | Discharge: 2019-05-26 | DRG: 807 | Disposition: A | Discharge status: Home / Self Care | Payer: Self-pay | Attending: Obstetrics and Gynecology | Admitting: Obstetrics and Gynecology

## 2019-05-25 ENCOUNTER — Encounter (HOSPITAL_COMMUNITY): Payer: Self-pay

## 2019-05-25 ENCOUNTER — Other Ambulatory Visit: Payer: Self-pay

## 2019-05-25 ENCOUNTER — Inpatient Hospital Stay (HOSPITAL_COMMUNITY): Payer: Self-pay | Admitting: Anesthesiology

## 2019-05-25 ENCOUNTER — Inpatient Hospital Stay (HOSPITAL_COMMUNITY): Payer: Self-pay

## 2019-05-25 DIAGNOSIS — O365990 Maternal care for other known or suspected poor fetal growth, unspecified trimester, not applicable or unspecified: Secondary | ICD-10-CM | POA: Diagnosis present

## 2019-05-25 DIAGNOSIS — O365930 Maternal care for other known or suspected poor fetal growth, third trimester, not applicable or unspecified: Principal | ICD-10-CM | POA: Diagnosis present

## 2019-05-25 DIAGNOSIS — F17210 Nicotine dependence, cigarettes, uncomplicated: Secondary | ICD-10-CM | POA: Diagnosis present

## 2019-05-25 DIAGNOSIS — Z20828 Contact with and (suspected) exposure to other viral communicable diseases: Secondary | ICD-10-CM | POA: Diagnosis present

## 2019-05-25 DIAGNOSIS — Z3A36 36 weeks gestation of pregnancy: Secondary | ICD-10-CM

## 2019-05-25 DIAGNOSIS — O99334 Smoking (tobacco) complicating childbirth: Secondary | ICD-10-CM | POA: Diagnosis present

## 2019-05-25 LAB — CBC
HCT: 30.4 % — ABNORMAL LOW (ref 36.0–46.0)
Hemoglobin: 10.2 g/dL — ABNORMAL LOW (ref 12.0–15.0)
MCH: 29.1 pg (ref 26.0–34.0)
MCHC: 33.6 g/dL (ref 30.0–36.0)
MCV: 86.9 fL (ref 80.0–100.0)
Platelets: 265 10*3/uL (ref 150–400)
RBC: 3.5 MIL/uL — ABNORMAL LOW (ref 3.87–5.11)
RDW: 14.3 % (ref 11.5–15.5)
WBC: 13.8 10*3/uL — ABNORMAL HIGH (ref 4.0–10.5)
nRBC: 0 % (ref 0.0–0.2)

## 2019-05-25 LAB — RPR: RPR Ser Ql: NONREACTIVE

## 2019-05-25 LAB — TYPE AND SCREEN
ABO/RH(D): O POS
Antibody Screen: NEGATIVE

## 2019-05-25 LAB — SARS CORONAVIRUS 2 BY RT PCR (HOSPITAL ORDER, PERFORMED IN ~~LOC~~ HOSPITAL LAB): SARS Coronavirus 2: NEGATIVE

## 2019-05-25 LAB — GROUP B STREP BY PCR: Group B strep by PCR: NEGATIVE

## 2019-05-25 MED ORDER — PHENYLEPHRINE 40 MCG/ML (10ML) SYRINGE FOR IV PUSH (FOR BLOOD PRESSURE SUPPORT)
80.0000 ug | PREFILLED_SYRINGE | INTRAVENOUS | Status: DC | PRN
  Filled 2019-05-25: qty 10

## 2019-05-25 MED ORDER — BENZOCAINE-MENTHOL 20-0.5 % EX AERO: 1.0000 "application " | INHALATION_SPRAY | CUTANEOUS | Status: DC | PRN

## 2019-05-25 MED ORDER — DIPHENHYDRAMINE HCL 50 MG/ML IJ SOLN: 12.5000 mg | INTRAMUSCULAR | Status: DC | PRN

## 2019-05-25 MED ORDER — LIDOCAINE HCL (PF) 1 % IJ SOLN
INTRAMUSCULAR | Status: DC | PRN
  Administered 2019-05-25 (×2): 5 mL via EPIDURAL

## 2019-05-25 MED ORDER — FENTANYL-BUPIVACAINE-NACL 0.5-0.125-0.9 MG/250ML-% EP SOLN
12.0000 mL/h | EPIDURAL | Status: DC | PRN
  Filled 2019-05-25: qty 250

## 2019-05-25 MED ORDER — EPHEDRINE 5 MG/ML INJ: 10.0000 mg | INTRAVENOUS | Status: DC | PRN

## 2019-05-25 MED ORDER — ACETAMINOPHEN 325 MG PO TABS
650.0000 mg | ORAL_TABLET | ORAL | Status: DC | PRN
  Administered 2019-05-25: 650 mg via ORAL
  Filled 2019-05-25: qty 2

## 2019-05-25 MED ORDER — TERBUTALINE SULFATE 1 MG/ML IJ SOLN: 0.2500 mg | Freq: Once | INTRAMUSCULAR | Status: DC | PRN

## 2019-05-25 MED ORDER — WITCH HAZEL-GLYCERIN EX PADS: 1.0000 "application " | MEDICATED_PAD | CUTANEOUS | Status: DC | PRN

## 2019-05-25 MED ORDER — OXYCODONE-ACETAMINOPHEN 5-325 MG PO TABS: 1.0000 | ORAL_TABLET | ORAL | Status: DC | PRN

## 2019-05-25 MED ORDER — DIPHENHYDRAMINE HCL 25 MG PO CAPS: 25.0000 mg | ORAL_CAPSULE | Freq: Four times a day (QID) | ORAL | Status: DC | PRN

## 2019-05-25 MED ORDER — ACETAMINOPHEN 325 MG PO TABS: 650.0000 mg | ORAL_TABLET | ORAL | Status: DC | PRN

## 2019-05-25 MED ORDER — VALACYCLOVIR HCL 500 MG PO TABS
1000.0000 mg | ORAL_TABLET | Freq: Every day | ORAL | Status: DC
  Filled 2019-05-25: qty 2

## 2019-05-25 MED ORDER — METHYLERGONOVINE MALEATE 0.2 MG/ML IJ SOLN
0.2000 mg | Freq: Once | INTRAMUSCULAR | Status: AC
  Administered 2019-05-25: 0.2 mg via INTRAMUSCULAR

## 2019-05-25 MED ORDER — SODIUM CHLORIDE (PF) 0.9 % IJ SOLN
INTRAMUSCULAR | Status: DC | PRN
  Administered 2019-05-25: 12 mL/h via EPIDURAL

## 2019-05-25 MED ORDER — BISACODYL 10 MG RE SUPP: 10.0000 mg | Freq: Every day | RECTAL | Status: DC | PRN

## 2019-05-25 MED ORDER — TETANUS-DIPHTH-ACELL PERTUSSIS 5-2.5-18.5 LF-MCG/0.5 IM SUSP: 0.5000 mL | Freq: Once | INTRAMUSCULAR | Status: DC

## 2019-05-25 MED ORDER — ZOLPIDEM TARTRATE 5 MG PO TABS: 5.0000 mg | ORAL_TABLET | Freq: Every evening | ORAL | Status: DC | PRN

## 2019-05-25 MED ORDER — LIDOCAINE HCL (PF) 1 % IJ SOLN: 30.0000 mL | INTRAMUSCULAR | Status: DC | PRN

## 2019-05-25 MED ORDER — PHENYLEPHRINE 40 MCG/ML (10ML) SYRINGE FOR IV PUSH (FOR BLOOD PRESSURE SUPPORT): 80.0000 ug | PREFILLED_SYRINGE | INTRAVENOUS | Status: DC | PRN

## 2019-05-25 MED ORDER — SIMETHICONE 80 MG PO CHEW: 80.0000 mg | CHEWABLE_TABLET | ORAL | Status: DC | PRN

## 2019-05-25 MED ORDER — MEDROXYPROGESTERONE ACETATE 150 MG/ML IM SUSP: 150.0000 mg | INTRAMUSCULAR | Status: DC | PRN

## 2019-05-25 MED ORDER — IBUPROFEN 600 MG PO TABS
600.0000 mg | ORAL_TABLET | Freq: Four times a day (QID) | ORAL | Status: DC
  Administered 2019-05-25 – 2019-05-26 (×4): 600 mg via ORAL
  Filled 2019-05-25 (×4): qty 1

## 2019-05-25 MED ORDER — OXYCODONE-ACETAMINOPHEN 5-325 MG PO TABS: 2.0000 | ORAL_TABLET | ORAL | Status: DC | PRN

## 2019-05-25 MED ORDER — FLEET ENEMA 7-19 GM/118ML RE ENEM: 1.0000 | ENEMA | Freq: Every day | RECTAL | Status: DC | PRN

## 2019-05-25 MED ORDER — PRENATAL MULTIVITAMIN CH
1.0000 | ORAL_TABLET | Freq: Every day | ORAL | Status: DC
  Administered 2019-05-26: 1 via ORAL
  Filled 2019-05-25: qty 1

## 2019-05-25 MED ORDER — COCONUT OIL OIL
1.0000 "application " | TOPICAL_OIL | Status: DC | PRN
  Administered 2019-05-26: 1 via TOPICAL

## 2019-05-25 MED ORDER — OXYTOCIN 40 UNITS IN NORMAL SALINE INFUSION - SIMPLE MED: 1.0000 m[IU]/min | INTRAVENOUS | Status: DC

## 2019-05-25 MED ORDER — ONDANSETRON HCL 4 MG/2ML IJ SOLN: 4.0000 mg | INTRAMUSCULAR | Status: DC | PRN

## 2019-05-25 MED ORDER — SOD CITRATE-CITRIC ACID 500-334 MG/5ML PO SOLN: 30.0000 mL | ORAL | Status: DC | PRN

## 2019-05-25 MED ORDER — MEASLES, MUMPS & RUBELLA VAC IJ SOLR: 0.5000 mL | Freq: Once | INTRAMUSCULAR | Status: DC

## 2019-05-25 MED ORDER — MONTELUKAST SODIUM 10 MG PO TABS
10.0000 mg | ORAL_TABLET | Freq: Every day | ORAL | Status: DC
  Administered 2019-05-25: 10 mg via ORAL
  Filled 2019-05-25: qty 1

## 2019-05-25 MED ORDER — METHYLERGONOVINE MALEATE 0.2 MG/ML IJ SOLN
INTRAMUSCULAR | Status: AC
  Filled 2019-05-25: qty 1

## 2019-05-25 MED ORDER — OXYTOCIN BOLUS FROM INFUSION
500.0000 mL | Freq: Once | INTRAVENOUS | Status: AC
  Administered 2019-05-25: 500 mL via INTRAVENOUS

## 2019-05-25 MED ORDER — OXYTOCIN 40 UNITS IN NORMAL SALINE INFUSION - SIMPLE MED
2.5000 [IU]/h | INTRAVENOUS | Status: DC
  Filled 2019-05-25: qty 1000

## 2019-05-25 MED ORDER — ONDANSETRON HCL 4 MG/2ML IJ SOLN: 4.0000 mg | Freq: Four times a day (QID) | INTRAMUSCULAR | Status: DC | PRN

## 2019-05-25 MED ORDER — ONDANSETRON HCL 4 MG PO TABS: 4.0000 mg | ORAL_TABLET | ORAL | Status: DC | PRN

## 2019-05-25 MED ORDER — LACTATED RINGERS IV SOLN: 500.0000 mL | INTRAVENOUS | Status: DC | PRN

## 2019-05-25 MED ORDER — ZOLPIDEM TARTRATE 5 MG PO TABS
5.0000 mg | ORAL_TABLET | Freq: Every evening | ORAL | Status: DC | PRN
  Administered 2019-05-25: 5 mg via ORAL
  Filled 2019-05-25: qty 1

## 2019-05-25 MED ORDER — LACTATED RINGERS IV SOLN
INTRAVENOUS | Status: DC
  Administered 2019-05-25: 01:00:00 via INTRAVENOUS

## 2019-05-25 MED ORDER — FENTANYL CITRATE (PF) 100 MCG/2ML IJ SOLN: 100.0000 ug | INTRAMUSCULAR | Status: DC | PRN

## 2019-05-25 MED ORDER — DIBUCAINE (PERIANAL) 1 % EX OINT: 1.0000 "application " | TOPICAL_OINTMENT | CUTANEOUS | Status: DC | PRN

## 2019-05-25 MED ORDER — SENNOSIDES-DOCUSATE SODIUM 8.6-50 MG PO TABS
2.0000 | ORAL_TABLET | ORAL | Status: DC
  Filled 2019-05-25: qty 2

## 2019-05-25 MED ORDER — LACTATED RINGERS IV SOLN: 500.0000 mL | Freq: Once | INTRAVENOUS | Status: DC

## 2019-05-25 MED ORDER — MISOPROSTOL 25 MCG QUARTER TABLET
25.0000 ug | ORAL_TABLET | ORAL | Status: DC | PRN
  Administered 2019-05-25 (×2): 25 ug via VAGINAL
  Filled 2019-05-25 (×2): qty 1

## 2019-05-25 NOTE — H&P (Signed)
Marissa Mitchell is a 28 y.o. G 2 P 0 at 18 w 1 day presents for IOL secondary to IUGR. Recommended by MFM Status post cytotec OB History    Gravida  2   Para  1   Term      Preterm  1   AB      Living        SAB      TAB      Ectopic      Multiple  0   Live Births  0          Past Medical History:  Diagnosis Date  . Anxiety    panick attacks  . HSV (herpes simplex virus) anogenital infection   . POTS (postural orthostatic tachycardia syndrome)    Past Surgical History:  Procedure Laterality Date  . BREAST ENHANCEMENT SURGERY    . COSMETIC SURGERY    . RHINOPLASTY     Family History: family history includes Epilepsy in her father; Lung cancer in her maternal grandfather and paternal grandfather. Social History:  reports that she has been smoking cigarettes. She has been smoking about 0.25 packs per day. She has never used smokeless tobacco. She reports previous alcohol use. She reports that she does not use drugs.     Maternal Diabetes: No Genetic Screening: Normal Maternal Ultrasounds/Referrals: IUGR Fetal Ultrasounds or other Referrals:  Referred to Materal Fetal Medicine  Maternal Substance Abuse:  No Significant Maternal Medications:  None Significant Maternal Lab Results:  None Other Comments:  None  Review of Systems  All other systems reviewed and are negative.  Maternal Medical History:  Prenatal complications: IUGR.     Dilation: 3.5 Effacement (%): 90 Station: Plus 1 Exam by:: Marissa Mitchell Blood pressure 97/75, pulse 78, temperature 98.3 F (36.8 C), temperature source Oral, resp. rate 18, height 5\' 4"  (1.626 m), weight 60.8 kg, last menstrual period 08/23/2018, SpO2 100 %. Maternal Exam:  Uterine Assessment: Contraction strength is moderate.  Contraction frequency is regular.   Abdomen: Fetal presentation: vertex     Fetal Exam Fetal State Assessment: Category I - tracings are normal.     Physical Exam  Nursing note and  vitals reviewed. Constitutional: She appears well-developed and well-nourished.  HENT:  Head: Normocephalic and atraumatic.  Eyes: Pupils are equal, round, and reactive to light.  Neck: Normal range of motion.  Cardiovascular: Normal rate and regular rhythm.  Respiratory: Effort normal.    Prenatal labs: ABO, Rh: --/--/O POS (10/29 0035) Antibody: NEG (10/29 0035) Rubella: Immune (05/06 0000) RPR: Nonreactive (05/06 0000)  HBsAg: Negative (05/06 0000)  HIV: Non-reactive (05/06 0000)  GBS:     Assessment/Plan: IUGR at 35 w 1 days Waiting on GBBS Epidural  Pitocin prn Anticipate NSVD   Marissa Mitchell 05/25/2019, 8:30 AM

## 2019-05-25 NOTE — Anesthesia Procedure Notes (Signed)
Epidural Patient location during procedure: OB  Staffing Anesthesiologist: Zoraida Havrilla, MD Performed: anesthesiologist   Preanesthetic Checklist Completed: patient identified, site marked, surgical consent, pre-op evaluation, timeout performed, IV checked, risks and benefits discussed and monitors and equipment checked  Epidural Patient position: sitting Prep: DuraPrep Patient monitoring: heart rate, continuous pulse ox and blood pressure Approach: right paramedian Location: L4-L5 Injection technique: LOR saline  Needle:  Needle type: Tuohy  Needle gauge: 17 G Needle length: 9 cm and 9 Needle insertion depth: 5 cm Catheter type: closed end flexible Catheter size: 20 Guage Catheter at skin depth: 9 cm Test dose: negative  Assessment Events: blood not aspirated, injection not painful, no injection resistance, negative IV test and no paresthesia  Additional Notes Patient identified. Risks/Benefits/Options discussed with patient including but not limited to bleeding, infection, nerve damage, paralysis, failed block, incomplete pain control, headache, blood pressure changes, nausea, vomiting, reactions to medication both or allergic, itching and postpartum back pain. Confirmed with bedside nurse the patient's most recent platelet count. Confirmed with patient that they are not currently taking any anticoagulation, have any bleeding history or any family history of bleeding disorders. Patient expressed understanding and wished to proceed. All questions were answered. Sterile technique was used throughout the entire procedure. Please see nursing notes for vital signs. Test dose was given through epidural needle and negative prior to continuing to dose epidural or start infusion. Warning signs of high block given to the patient including shortness of breath, tingling/numbness in hands, complete motor block, or any concerning symptoms with instructions to call for help. Patient was given  instructions on fall risk and not to get out of bed. All questions and concerns addressed with instructions to call with any issues.     

## 2019-05-25 NOTE — Anesthesia Preprocedure Evaluation (Signed)
Anesthesia Evaluation  Patient identified by MRN, date of birth, ID band Patient awake    Reviewed: Allergy & Precautions, H&P , NPO status , Patient's Chart, lab work & pertinent test results  History of Anesthesia Complications Negative for: history of anesthetic complications  Airway Mallampati: II  TM Distance: >3 FB Neck ROM: full    Dental no notable dental hx. (+) Teeth Intact   Pulmonary neg pulmonary ROS, Current Smoker,    Pulmonary exam normal breath sounds clear to auscultation       Cardiovascular negative cardio ROS Normal cardiovascular exam Rhythm:regular Rate:Normal     Neuro/Psych negative neurological ROS  negative psych ROS   GI/Hepatic negative GI ROS, Neg liver ROS,   Endo/Other  negative endocrine ROS  Renal/GU negative Renal ROS  negative genitourinary   Musculoskeletal   Abdominal   Peds  Hematology negative hematology ROS (+)   Anesthesia Other Findings   Reproductive/Obstetrics (+) Pregnancy                             Anesthesia Physical Anesthesia Plan  ASA: II  Anesthesia Plan: Epidural   Post-op Pain Management:    Induction:   PONV Risk Score and Plan:   Airway Management Planned:   Additional Equipment:   Intra-op Plan:   Post-operative Plan:   Informed Consent: I have reviewed the patients History and Physical, chart, labs and discussed the procedure including the risks, benefits and alternatives for the proposed anesthesia with the patient or authorized representative who has indicated his/her understanding and acceptance.     Plan Discussed with:   Anesthesia Plan Comments:         Anesthesia Quick Evaluation  

## 2019-05-25 NOTE — Progress Notes (Signed)
Dr. Corinna Capra contacted regarding absence of patient's GBS status, IV pain medication order PRN, ambien order PRN, and patient's SVE 1.5/thick/-1   Dr. Corinna Capra notified nursing staff that Dr. Helane Rima would obtain GBS swab in AM after patient receives cytotec for method of induction throughout night.  Notify MD of ROM or active labor.   Orders received for IV fentanyl and ambien for HS management of care. Patient notified. Verbalized understanding.  Cassie Freer, RN 05/25/19 at 0130

## 2019-05-25 NOTE — Plan of Care (Signed)
A.Ilamae Geng, RN 

## 2019-05-25 NOTE — Anesthesia Postprocedure Evaluation (Signed)
Anesthesia Post Note  Patient: Marissa Mitchell  Procedure(s) Performed: AN AD HOC LABOR EPIDURAL     Patient location during evaluation: Mother Baby Anesthesia Type: Epidural Level of consciousness: awake and alert Pain management: pain level controlled Vital Signs Assessment: post-procedure vital signs reviewed and stable Respiratory status: spontaneous breathing, nonlabored ventilation and respiratory function stable Cardiovascular status: stable Postop Assessment: no headache, no backache and epidural receding Anesthetic complications: no    Last Vitals:  Vitals:   05/25/19 1217 05/25/19 1250  BP: 112/69 112/73  Pulse: (!) 55 (!) 56  Resp: 18 18  Temp:  36.7 C  SpO2:      Last Pain:  Vitals:   05/25/19 1250  TempSrc: Oral  PainSc:    Pain Goal:                   [name]

## 2019-05-25 NOTE — MAU Note (Signed)
Covid swab obtained without difficulty and pt tol well. No symptoms 

## 2019-05-25 NOTE — Lactation Note (Addendum)
This note was copied from a baby's chart. Lactation Consultation Note  Patient Name: Marissa Mitchell [id number] Date: 05/25/2019 Reason for consult: Initial assessment;1st time breastfeeding;Late-preterm 34-36.6wks;Infant < 6lbs P1, 11 hour female infant, LPTI less than 5 lbs at birth. Tools given:  DEBP for breast stimulation and induction, mom with breast implants.  Per mom, she did not breastfeed her first child she had a stillbirth but had breastmilk. Per mom, she had breast implants and nipple rings that she took out 10 years ago. Mom was expressing breast milk from holes where nipple rings were LC informed mom that is normal. Infant had 3 voids and 3 stools since birth. Per mom, she has medela DEBP at home. Mom latched infant on right breast using cross cradle hold, LC ask mom bring infant to breast, wide mouth, tongue down and bring chin first with top lip flange out, infant had latch with depth and swallows observed. Per mom, she is not feeling any pain with latch and when infant came off breast mom's nipples were rounded and not pintched. LC discussed LPTI policy: Mom knows to do as much STS as possible, dim lights to decrease stimulation, feed infant 8 to 12 times in 24 hours with cues and not make infant wait to breastfeed. Mom knows not to breastfeed infant longer than 30 minutes. LC discussed LPTI policy and green sheet given, mom hand expressed and taught back giving infant 5 ml of colostrum by spoon. LC discussed possible supplement due to infant being LPTI and less than 5 lbs, mom plans to hand express and supplement with her EBM after each feeding following directions for supplement based on infant's age and hours of life. Infant started cuing again when mom placed infant on chest doing STS. Mom latched infant back on breast without assistance from [facility] as LC was leaving the room. Mom knows to call Nurse or [place] if she has any further questions, concerns or needs assistance with  latching infant to breast. Mom was given DEBP by Nurse, [place] reviewed how to use with mom, mom plans to pump every 3 hours both breast for 15 minutes on initial setting. Mom shown how to use DEBP & how to disassemble, clean, & reassemble parts. Mom made aware of O/P services, breastfeeding support groups, community resources, and our phone # for post-discharge questions.     Maternal Data Formula Feeding for Exclusion: No Has patient been taught Hand Expression?: Yes(Infant was given 5 ml of colostrum by sppon and mom taught back hand expression.) Does the patient have breastfeeding experience prior to this delivery?: No  Feeding Feeding Type: Breast Fed  LATCH Score Latch: Grasps breast easily, tongue down, lips flanged, rhythmical sucking.  Audible Swallowing: Spontaneous and intermittent  Type of Nipple: Everted at rest and after stimulation  Comfort (Breast/Nipple): Soft / non-tender  Hold (Positioning): Assistance needed to correctly position infant at breast and maintain latch.  LATCH Score: 9  Interventions Interventions: Breast feeding basics reviewed;Breast compression;Assisted with latch;Adjust position;DEBP;Support pillows;Skin to skin;Breast massage;Position options;Hand express;Expressed milk  Lactation Tools Discussed/Used WIC Program: No Pump Review: Setup, frequency, and cleaning;Milk Storage Initiated by:: by Nurse Date initiated:: 05/25/19   Consult Status Consult Status: Follow-up Date: 05/25/19 Follow-up type: In-patient    Vicente Serene 05/25/2019, 10:13 PM

## 2019-05-26 LAB — CBC
HCT: 28.6 % — ABNORMAL LOW (ref 36.0–46.0)
Hemoglobin: 9.2 g/dL — ABNORMAL LOW (ref 12.0–15.0)
MCH: 28.7 pg (ref 26.0–34.0)
MCHC: 32.2 g/dL (ref 30.0–36.0)
MCV: 89.1 fL (ref 80.0–100.0)
Platelets: 246 10*3/uL (ref 150–400)
RBC: 3.21 MIL/uL — ABNORMAL LOW (ref 3.87–5.11)
RDW: 14.3 % (ref 11.5–15.5)
WBC: 12.2 10*3/uL — ABNORMAL HIGH (ref 4.0–10.5)
nRBC: 0 % (ref 0.0–0.2)

## 2019-05-26 LAB — RUBELLA SCREEN: Rubella: 3.47 index (ref >0.99)

## 2019-05-26 MED ORDER — IBUPROFEN 600 MG PO TABS: 600.0000 mg | ORAL_TABLET | Freq: Four times a day (QID) | ORAL | 0 refills | Status: DC | PRN

## 2019-05-26 MED ORDER — ACETAMINOPHEN 325 MG PO TABS: 650.0000 mg | ORAL_TABLET | Freq: Four times a day (QID) | ORAL | 0 refills | Status: DC | PRN

## 2019-05-26 NOTE — Progress Notes (Signed)
Post Partum Day 1 Subjective: no complaints, up ad lib, voiding, tolerating PO and + flatus  Objective: Blood pressure 99/64, pulse 61, temperature 97.8 F (36.6 C), temperature source Oral, resp. rate 18, height 5\' 4"  (1.626 m), weight 60.8 kg, last menstrual period 08/23/2018, SpO2 98 %, unknown if currently breastfeeding.  Physical Exam:  General: alert, cooperative and no distress Lochia: appropriate Uterine Fundus: firm Incision: healing well DVT Evaluation: No evidence of DVT seen on physical exam.  Recent Labs    05/25/19 0029 05/26/19 0536  HGB 10.2* 9.2*  HCT 30.4* 28.6*    Assessment/Plan: Plan for discharge tomorrow D/W discharge instructions in case baby can be discharged today  LOS: 1 day   Shon Millet II 05/26/2019, 8:38 AM

## 2019-05-26 NOTE — Discharge Summary (Signed)
Obstetric Discharge Summary Reason for Admission: induction of labor Prenatal Procedures: none Intrapartum Procedures: spontaneous vaginal delivery Postpartum Procedures: none Complications-Operative and Postpartum: none Hemoglobin  Date Value Ref Range Status  05/26/2019 9.2 (L) 12.0 - 15.0 g/dL Final   HGB  Date Value Ref Range Status  11/19/2011 12.4 12.0 - 16.0 g/dL Final   HCT  Date Value Ref Range Status  05/26/2019 28.6 (L) 36.0 - 46.0 % Final  11/19/2011 36.8 35.0 - 47.0 % Final    Physical Exam:  General: alert, cooperative and no distress Lochia: appropriate Uterine Fundus: firm Incision: healing well DVT Evaluation: No evidence of DVT seen on physical exam.  Discharge Diagnoses: Term Pregnancy-delivered  Discharge Information: Date: 05/26/2019 Activity: pelvic rest Diet: routine Medications: PNV, Ibuprofen and Tylenol Condition: stable Instructions: refer to practice specific booklet Discharge to: home   Newborn Data: Live born female  Birth Weight: 4 lb 12.8 oz (2177 g) APGAR: 9, 9  Newborn Delivery   Birth date/time: 05/25/2019 10:49:00 Delivery type: Vaginal, Spontaneous      Home with mother.  Shon Millet II 05/26/2019, 2:22 PM

## 2019-05-26 NOTE — Discharge Instructions (Signed)
Pt verbalizes understanding of D/C instructions.

## 2019-05-26 NOTE — Lactation Note (Addendum)
This note was copied from a baby's chart. Lactation Consultation Note  Patient Name: Girl Geisha Abernathy [id number] Date: 05/26/2019   4.5% weight loss in 24 hours.  6 voids/4 stools in the last 24 hours.  Baby [redacted]w[redacted]d < 5 lbs. Baby 27 hours and sleeping and mother is pumping with her personal Medela pump on L side only. Discussed the added stimulation if mother pumps both breasts at the same time. Encouraged her to also hand express and give all expressed volume back to baby. Spoke with Beatrix Fetters RN who viewed feeding and states baby is latching well w/ swallows. Mother wants to only supplement with her own breastmilk. Mother feels her breasts are filling.        Maternal Data    Feeding Feeding Type: Breast Fed  LATCH Score Latch: Grasps breast easily, tongue down, lips flanged, rhythmical sucking.  Audible Swallowing: A few with stimulation  Type of Nipple: Everted at rest and after stimulation  Comfort (Breast/Nipple): Soft / non-tender  Hold (Positioning): No assistance needed to correctly position infant at breast.  LATCH Score: 9  Interventions Interventions: Breast feeding basics reviewed;Assisted with latch;Skin to skin  Lactation Tools Discussed/Used     Consult Status      Carlye Grippe 05/26/2019, 2:38 PM

## 2019-05-27 ENCOUNTER — Ambulatory Visit: Payer: Self-pay

## 2019-05-27 NOTE — Lactation Note (Signed)
This note was copied from a baby's chart. Lactation Consultation Note  Patient Name: Marissa Mitchell [id number]'[id number] Date: 05/27/2019 Reason for consult: Follow-up assessment;Primapara;1st time breastfeeding;Infant < 6lbs;Late-preterm 34-36.6wks;Infant weight loss;Hyperbilirubinemia  LC in to visit with P2 (first baby neonatal demise at 58 wks) Baby 33 hrs old and at 7.2% weight loss, output good.  Phototherapy started 10/30 at 2200.  Bilirubin already responding and has dropped.  Baby continues on single light phototherapy.  Mom had just fed baby for 30 mins on the breast, and offered baby 22 cal formula by bottle.  Baby took very little before falling asleep.  Poured 30 ml in volu-feed and reviewed how to stimulate baby to take the bottle.  Offered to pace bottle feed baby while Mom double pumps, but Mom politely declined.  Talked about volume of supplement increasing to 20-30 ml after 48 hrs old.  Discussed normal LPTI sleepiness increasing on day 2-5.  Talked about jaundice causing sleepiness on the breast.    Plan suggested- 1- Keep baby STS as much as possible 2- Watch for cues, otherwise awaken baby for feedings after 3 hrs. 3- Limit breastfeeding to 30 mins maximum to prevent overtiring baby 4- Pump both breasts on initiation setting 5- Supplement with 20-30 ml EBM+/formula by paced bottle 6- ask for help prn.   Mom has her Medela PIS from home.  Recommended using the Symphony DEBP as it is stronger.  Mom states she isn't getting "anything" when she pumps.    Encouraged breast massage and hand express.    Mom knows to call prn for assistance.   Interventions Interventions: Breast feeding basics reviewed;Skin to skin;Breast massage;Hand express;Pre-pump if needed;DEBP;Expressed milk  Lactation Tools Discussed/Used Tools: Pump;Bottle Breast pump type: Double-Electric Breast Pump   Consult Status Consult Status: Follow-up Date: 05/28/19 Follow-up type: In-patient    Marissa Mitchell 05/27/2019, 9:41 AM

## 2019-05-28 ENCOUNTER — Ambulatory Visit: Payer: Self-pay

## 2019-05-28 NOTE — Lactation Note (Signed)
This note was copied from a baby's chart. Lactation Consultation Note  Patient Name: Marissa Mitchell [id number] Date: 05/28/2019 Reason for consult: Follow-up assessment;Infant < 6lbs;Other (Comment);Late-preterm 34-36.6wks;Infant weight loss;1st time breastfeeding(IUGR)  44 hours old LPI < 5 lbs who is now being partially BF and formula fed by her mother, she's a P2 but this is her first time BF, her first baby passed away at 36 weeks (fetal demise). Mom and baby are going home today, she's at 4% weight loss and was taken off phototherapy this morning.   Mom told LC she hasn't been pumping every 3 hours but that she's been using her [place] pump and letting the milk from one breast leak into the pump while nursing baby on the other one. Then she showed [place] how much she was able to collect, mom had about 3 ounces of colostrum in the refrigerator; praised her for her efforts.  Reviewed breastmilk storage guidelines, pumping schedule, LPI policy, prevention/treatment of sore nipples, engorgement prevention/treatment, discharge instructions and red flags on when to call baby's pediatrician. Parents said baby's stools are already changing color, they're green.    Mom also had a DEBP at home, stressed the importance of continuous pumping and that fact that LPI < 5 lbs are no able to fully empty the breast and that they need to be supplemented with EBM/Similac 22 calorie formula every 3 hours, at least one ounce or more, parents aware that feeding plan will be revised with baby's pediatrician on the next appt. Parents reported all questions and concerns were answered, they're both aware of [place] OP services and will contact PRN.  Maternal Data    Feeding Feeding Type: Breast Fed  LATCH Score                   Interventions Interventions: Breast feeding basics reviewed  Lactation Tools Discussed/Used     Consult Status Consult Status: Complete Date: 05/28/19 Follow-up type: Call as  needed    Sanjiv Castorena Francene Boyers 05/28/2019, 9:52 AM

## 2020-06-05 ENCOUNTER — Other Ambulatory Visit: Payer: Self-pay

## 2020-06-05 ENCOUNTER — Ambulatory Visit: Payer: BC Managed Care – PPO | Attending: Obstetrics & Gynecology | Admitting: Obstetrics

## 2020-06-05 ENCOUNTER — Ambulatory Visit: Payer: BC Managed Care – PPO | Admitting: *Deleted

## 2020-06-05 DIAGNOSIS — I498 Other specified cardiac arrhythmias: Secondary | ICD-10-CM | POA: Diagnosis not present

## 2020-06-05 DIAGNOSIS — Z3169 Encounter for other general counseling and advice on procreation: Secondary | ICD-10-CM | POA: Diagnosis not present

## 2020-06-05 DIAGNOSIS — O365990 Maternal care for other known or suspected poor fetal growth, unspecified trimester, not applicable or unspecified: Secondary | ICD-10-CM

## 2020-06-05 DIAGNOSIS — O09299 Supervision of pregnancy with other poor reproductive or obstetric history, unspecified trimester: Secondary | ICD-10-CM

## 2020-06-05 DIAGNOSIS — Z8759 Personal history of other complications of pregnancy, childbirth and the puerperium: Secondary | ICD-10-CM

## 2020-06-05 DIAGNOSIS — O364XX0 Maternal care for intrauterine death, not applicable or unspecified: Secondary | ICD-10-CM

## 2020-06-05 NOTE — Progress Notes (Signed)
MD Consult(Preconception) for history IUFD with Dr. Parke Poisson BP 103/71 Pulse 68

## 2020-06-05 NOTE — Progress Notes (Signed)
MFM Consult Note  Marissa Mitchell is a 29 year old gravida 2 para 1 who was seen for preconception consultation due to a prior poor obstetrical history.  Her first pregnancy resulted in a fetal demise at 36 weeks and 5 days.  The cause of the fetal demise remains undetermined.  She reports that she felt intense back/hip pain and reported an increased amount of vaginal discharge prior to the fetal demise.  The birth weight of the demised fetus was 4 pounds 11 ounces (growth restricted).  Her second pregnancy was also complicated by a growth restricted fetus.  She had a growth ultrasound performed at 35 weeks and 6 days showing an overall EFW that was at the 1st percentile for her gestational age.  To avoid another fetal demise, she was delivered following the administration of a course of antenatal corticosteroids.  Marissa Mitchell reports that her child did not require a NICU admission and is doing fine today.  The patient's past medical history includes postural orthostatic tachycardia syndrome (POTS) that is currently treated with atenolol 25 mg daily.  She reports that she is being managed by a cardiologist for POTS.  She reports that her symptoms related to POTS are improved during pregnancy, probably due to the increase in blood volume.  She denies any other significant past medical history and denies any history of kidney disease or chronic hypertension.  She denies any prior surgeries.  She reports two prior 36+ week vaginal deliveries.  As the patient and her husband are considering conceiving another pregnancy soon, she was seen in consultation today to discuss the management of her future pregnancy, in order to provide her with the best obstetrical outcomes.  Marissa Mitchell and her husband were reassured that many patients who have had prior poor obstetrical outcomes, will have a good outcome in their subsequent pregnancies.  As she has had two prior pregnancies complicated by an IUGR fetus, there may be an  increased risk that her future pregnancies may also be complicated by fetal growth restriction.  They were advised that hopefully with intense maternal and fetal surveillance, we will be able to help her attain a good outcome during her future pregnancy.  During the preconception period, she should start taking prenatal vitamins with an extra folic acid to decrease her risk of having a fetus with neural tube defects.  She should have a cell free DNA test drawn at between 10 to 12 weeks of her future pregnancy to screen for fetal aneuploidy.  She should have a first trimester ultrasound performed at around 12 weeks to assess the nuchal translucency and to confirm her dates.  The patient reports that she has been advised that she has polycystic ovarian syndrome and the [facility] for her prior pregnancies were always uncertain due to this history.  Given that she may require a late preterm birth due to her history of IUGR, it will be very important to verify her dates with the first trimester ultrasound.  She should have a detailed fetal anatomy scan performed at 19 weeks to screen for fetal anomalies.  She should then be followed with growth ultrasounds every 4 to 5 weeks to ensure that the fetal growth is within normal limits.  Weekly fetal testing should be started at around 32 weeks.  Hopefully, with increased surveillance, we will be able to help her achieve a successful pregnancy outcome.  We will be happy to see her for her ultrasound exams during her future pregnancy.  Although controversial as she  does not have a history of preeclampsia, she was advised that she may start taking a daily baby aspirin (81 mg) starting at 10 to 12 weeks of her future pregnancy.  She was advised that studies have indicated a daily baby aspirin may prevent preeclampsia and possibly decrease her risk of a preterm birth.  She was reassured that taking a daily baby aspirin throughout her future pregnancy will not harm her fetus.  Due  to her history of a prior fetal demise at 36+ weeks, should the fetal growth be within normal limits, delivery of her future pregnancy may be considered at around 37 weeks.  The patient understands that there may be a slightly increased risk of short-term respiratory issues for delivery at 37 weeks. She is willing to accept this risk to avoid another fetal demise.    Should IUGR be detected again in her future pregnancy, delivery will be recommended at around 36-37 weeks.  At the end of the consultation, the patient and her husband stated that all their questions had been answered to their complete satisfaction.    Thank you for referring this patient for a Maternal-Fetal Medicine preconception consultation.  Total time spent in consultation: 30 minutes.  This note was copied from an original document generated using [employer].  Recommendations: Start taking prenatal vitamins with extra folic acid during the preconception period Once she conceives, she should have:  First trimester ultrasound performed to confirm her dates  Cell free DNA test at around 10 to 12 weeks  Start daily baby aspirin (81 mg daily) at between 10 to 12 weeks  She may continue taking atenolol for treatment of POTS as recommended by her cardiologist  Detailed fetal anatomy scan at around 19 weeks  Serial growth ultrasounds every 4 to 5 weeks  Weekly fetal testing starting at 32 weeks Delivery at around 37 weeks

## 2020-07-16 ENCOUNTER — Ambulatory Visit: Payer: BC Managed Care – PPO | Admitting: Student

## 2020-09-24 ENCOUNTER — Other Ambulatory Visit: Payer: Self-pay

## 2020-09-24 ENCOUNTER — Ambulatory Visit: Payer: BC Managed Care – PPO | Admitting: Internal Medicine

## 2020-09-24 ENCOUNTER — Encounter: Payer: Self-pay | Admitting: Internal Medicine

## 2020-09-24 VITALS — BP 104/76 | HR 70 | Ht 64.0 in | Wt 103.6 lb

## 2020-09-24 DIAGNOSIS — G90A Postural orthostatic tachycardia syndrome (POTS): Secondary | ICD-10-CM

## 2020-09-24 DIAGNOSIS — I498 Other specified cardiac arrhythmias: Secondary | ICD-10-CM | POA: Diagnosis not present

## 2020-09-24 NOTE — Progress Notes (Signed)
ELECTROPHYSIOLOGY CONSULT NOTE  Patient ID: Marissa Mitchell, MRN: [id number], DOB/AGE: 1990-08-18 29 y.o. Admit date: (Not on file) Date of Consult: 09/24/2020  Primary Physician: Barbie Banner, MD Primary Cardiologist: none Marissa Mitchell is a 30 y.o. female who is being seen today for the evaluation of orthostatic leg pain at the request of Dr. Damita Lack.   Chief Complaint: leg painj   HPI [name] is a 30 y.o. female whom I saw last in 2016 and originally 2012 who at that time had a diagnosis of POTS. Significant improvement was made with salt and water repletion.  We also treated her with low-dose atenolol to significant benefit.  Since having seen her last, she has had 2 pregnancies.  Her symptoms were largely quiescient during these pregnancies.  She was maintained on her atenolol through the pregnancies.  Unfortunately, she lost one of the children at 37 weeks; the other child was delivered early when she began to manifest poor growth.  (Name Marissa Mitchell)  She continues to have orthostatic intolerance shower intolerance Jacuzzi intolerance but no syncope.  Early a.m. lightheadedness.  Her biggest complaint however is orthostatic leg pain in her calves particularly.  Is abated by lying back down and aggravated by sitting back up.  No known other precipitants.  She has an anxiety issue that has included problems with eating, agoraphobia, issues of being home alone with her child.  She is currently undergoing counseling.  She has been maintained for many years on alprazolam.  Apparently she tried SSRIs in the past without benefit.  Her diet is replete of fluid but depleted sodium  Menses are moderately difficult particularly on the first day with very heavy bleeding with aggravation of both lightheadedness and her leg pain   Past Medical History:  Diagnosis Date  . Anxiety    panick attacks  . HSV (herpes simplex virus) anogenital infection   . POTS (postural orthostatic  tachycardia syndrome)       Surgical History:  Past Surgical History:  Procedure Laterality Date  . BREAST ENHANCEMENT SURGERY    . COSMETIC SURGERY    . RHINOPLASTY       Home Meds: Prior to Admission medications   Medication Sig Start Date End Date Taking? Authorizing Provider  ALPRAZolam Prudy Feeler) 1 MG tablet Take 0.5 mg by mouth daily.  08/29/18  Yes [provider]  atenolol (TENORMIN) 25 MG tablet Take 25 mg by mouth daily.  08/29/18  Yes [provider]  folic acid (FOLVITE) 1 MG tablet Take 1 mg by mouth daily.   Yes [provider]  montelukast (SINGULAIR) 10 MG tablet Take 10 mg by mouth at bedtime.  06/17/18  Yes [provider]  naproxen sodium (ALEVE) 220 MG tablet Take 220 mg by mouth daily as needed (pain).   Yes [provider]  ondansetron (ZOFRAN-ODT) 4 MG disintegrating tablet Take 4 mg by mouth every 6 (six) hours as needed for nausea/vomiting. 06/17/20  Yes [provider]      Allergies:  Allergies  Allergen Reactions  . Citalopram Hydrobromide Hives  . Percocet [Oxycodone-Acetaminophen] Hives    Social History   Socioeconomic History  . Marital status: Married    Spouse name: Barbara Cower  . Number of children: 0  . Years of education: Not on file  . Highest education level: Not on file  Occupational History  . Not on file  Tobacco Use  . Smoking status: Current Some Day Smoker  Packs/day: 0.25    Types: Cigarettes  . Smokeless tobacco: Never Used  . Tobacco comment: 1 cig per week  Vaping Use  . Vaping Use: Former  Substance and Sexual Activity  . Alcohol use: Not Currently    Comment: occasional drinker  . Drug use: No  . Sexual activity: Yes    Birth control/protection: None  Other Topics Concern  . Not on file  Social History Narrative  . Not on file   Social Determinants of Health   Financial Resource Strain: Not on file  Food Insecurity: Not on file  Transportation Needs: Not on file   Physical Activity: Not on file  Stress: Not on file  Social Connections: Not on file  Intimate Partner Violence: Not on file     Family History  Problem Relation Age of Onset  . Epilepsy Father   . COPD Maternal Grandmother   . Diabetes Maternal Grandmother   . Lung cancer Maternal Grandfather   . Lung cancer Paternal Grandfather      ROS:  Please see the history of present illness.     All other systems reviewed and negative.    Physical Exam  unknown if currently breastfeeding. General: Well developed, well nourished female in no acute distress. Head: Normocephalic, atraumatic, sclera non-icteric, no xanthomas, nares are without discharge. EENT: normal Lymph Nodes:  none Back: without scoliosis/kyphosis, no CVA tendersness Neck: Negative for carotid bruits. JVD not elevated. Lungs: Clear bilaterally to auscultation without wheezes, rales, or rhonchi. Breathing is unlabored. Heart: RRR with S1 S2. No murmur , rubs, or gallops appreciated. Abdomen: Soft, non-tender, non-distended with normoactive bowel sounds. No hepatomegaly. No rebound/guarding. No obvious abdominal masses. Msk:  Strength and tone appear normal for age. Extremities: No clubbing or cyanosis. No edema.  Distal pedal pulses are 2+ and equal bilaterally. Skin: Warm and Dry Neuro: Alert and oriented X 3. CN III-XII intact Grossly normal sensory and motor function . Psych:  Responds to questions appropriately with a normal affect.      Labs: Cardiac Enzymes No results for input(s): CKTOTAL, CKMB, TROPONINI in the last 72 hours. CBC Lab Results  Component Value Date   WBC 12.2 (H) 05/26/2019   HGB 9.2 (L) 05/26/2019   HCT 28.6 (L) 05/26/2019   MCV 89.1 05/26/2019   PLT 246 05/26/2019   PROTIME: No results for input(s): LABPROT, INR in the last 72 hours. Chemistry No results for input(s): NA, K, CL, CO2, BUN, CREATININE, CALCIUM, PROT, BILITOT, ALKPHOS, ALT, AST, GLUCOSE in the last 168  hours.  Invalid input(s): LABALBU Lipids No results found for: CHOL, HDL, LDLCALC, TRIG BNP No results found for: PROBNP Thyroid Function Tests: No results for input(s): TSH, T4TOTAL, T3FREE, THYROIDAB in the last 72 hours.  Invalid input(s): FREET3    Miscellaneous No results found for: DDIMER  Radiology/Studies:  No results found.  EKG: sinus A@ 70 13/09/41   Assessment and Plan:  Orthostatic hypotension  Orthostatic leg pain  Anxiety   The patient had POTS when I saw her 2012; criteria for POTS no longer evident.  I do not know whether the atenolol is attenuating the heart rate to the degree that there is no compensatory tachycardia and hence the blood pressure falling or whether she has just changed the manifestation of dysautonomia and orthostatic hypotension.  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  The mechanism seems to be related to venous compression impairing muscle blood flow resulting in ischemia.  I do not understand why it would be worse in the morning where she would be relatively volume deplete; however, it is the only mechanism described.  I have given her some exercises to try to mobilize fluid before she gets going in the morning but also with her lightheadedness in the morning have encouraged her to drink about 20 ounces of water before she gets out of bed.  We have also discussed compression.  Her lightheadedness is only minimally an issue as opposed to her leg pain so I have taken the unusual tack of recommending calf compression as opposed to thigh and lower abdominal compression.  We also discussed the role of atenolol; with her heart rates  controlled that her blood pressure falling I suggested that we discontinue it.  We will wean her off over the next 2 weeks.  I have also advised her that is a class D drug as relates to pregnancy, associated with IUGR and placental insufficiency  I am concerned about her anxiety.  She is making great headway's undergoing counseling now for control.  She has been on benzodiazepines for a long time and is suggested that she seek out pharmacological expertise to see if there is a way to get her off of the benzo and onto something else that could be useful.  I have given her the name of Dr. Edwin Dada in [facility]

## 2020-09-24 NOTE — Patient Instructions (Addendum)
Medication Instructions:  Your physician has recommended you make the following change in your medication:   ** Stop Atenolol  *If you need a refill on your cardiac medications before your next appointment, please call your pharmacy*   Lab Work: None ordered.  If you have labs (blood work) drawn today and your tests are completely normal, you will receive your results only by: Marland Kitchen MyChart Message (if you have MyChart) OR . A paper copy in the mail If you have any lab test that is abnormal or we need to change your treatment, we will call you to review the results.   Testing/Procedures: None ordered.    Follow-Up: At [facility], you and your health needs are our priority.  As part of our continuing mission to provide you with exceptional heart care, we have created designated Provider Care Teams.  These Care Teams include your primary Cardiologist (physician) and Advanced Practice Providers (APPs -  Physician Assistants and Nurse Practitioners) who all work together to provide you with the care you need, when you need it.  We recommend signing up for the patient portal called "MyChart".  Sign up information is provided on this After Visit Summary.  MyChart is used to connect with patients for Virtual Visits (Telemedicine).  Patients are able to view lab/test results, encounter notes, upcoming appointments, etc.  Non-urgent messages can be sent to your provider as well.   To learn more about what you can do with MyChart, go to [email or web address].    Your next appointment:   4 month(s)  The format for your next appointment:   In Person  Provider:   Sherryl Manges, MD   Other Instructions Calf compression as discussed with Dr Graciela Husbands Exercise and hydration  Salt Repletion - Choose one  Pedialyte Advance Care Liquid IV  NUN Tri-Oral [employer] Chicken Noodle Soup

## 2020-12-04 IMAGING — US US RENAL
1 series · 15 of 25 positions shown · non-contrast
Comparison: None.

CLINICAL DATA: Right flank pain in a 28 week pregnant patient.

EXAM:
RENAL / URINARY TRACT ULTRASOUND COMPLETE

[Series 1: us renal · 15 of 48 slices shown]
[im 1/48]
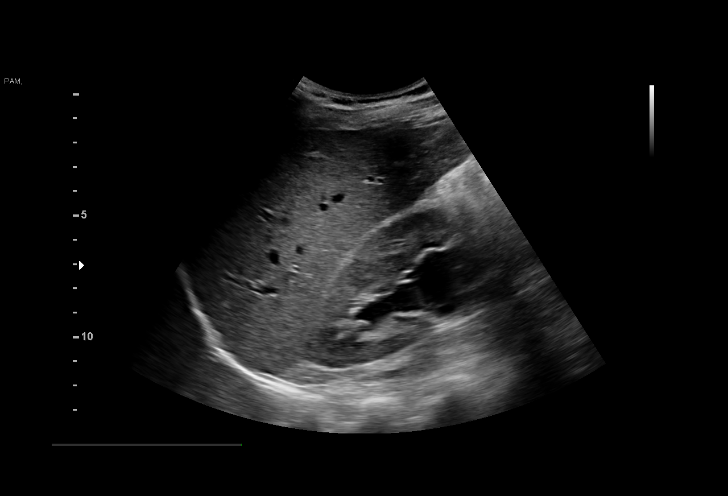
[im 4/48]
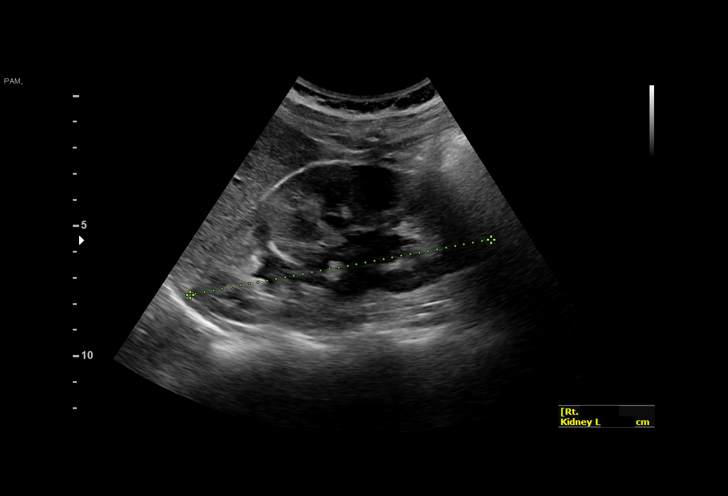
[im 8/48]
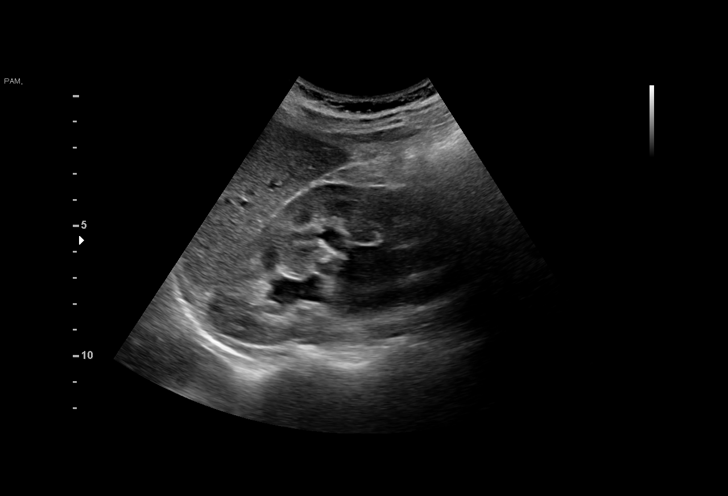
[im 10/48]
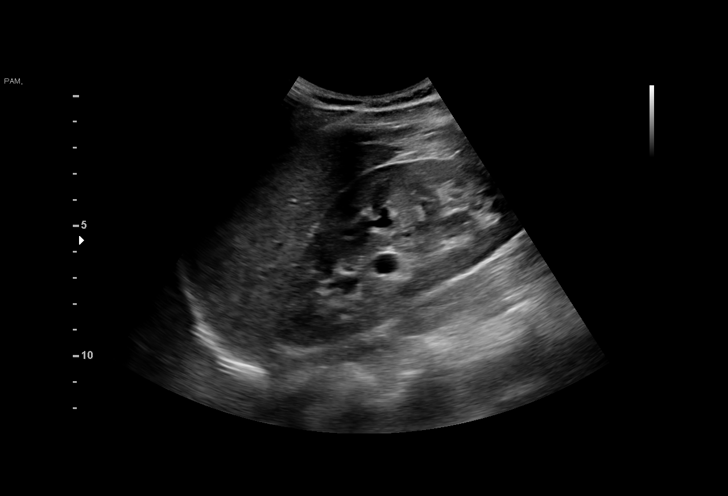
[im 14/48]
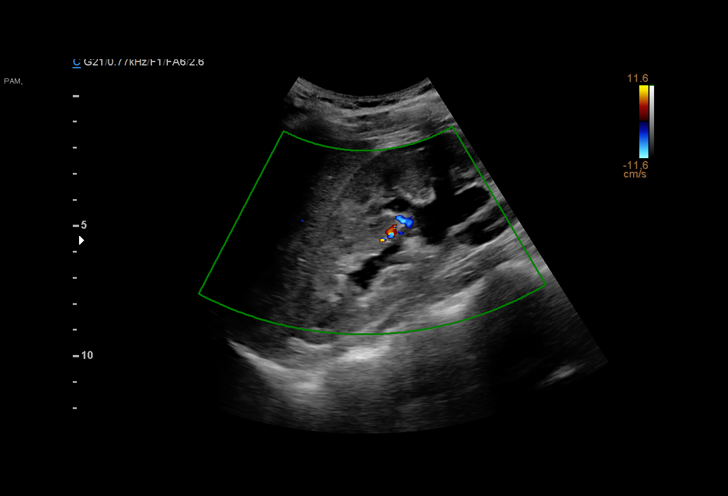
[im 18/48]
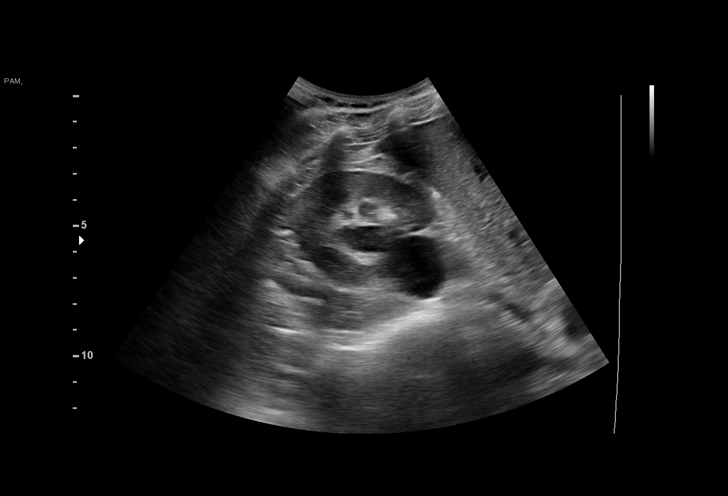
[im 20/48]
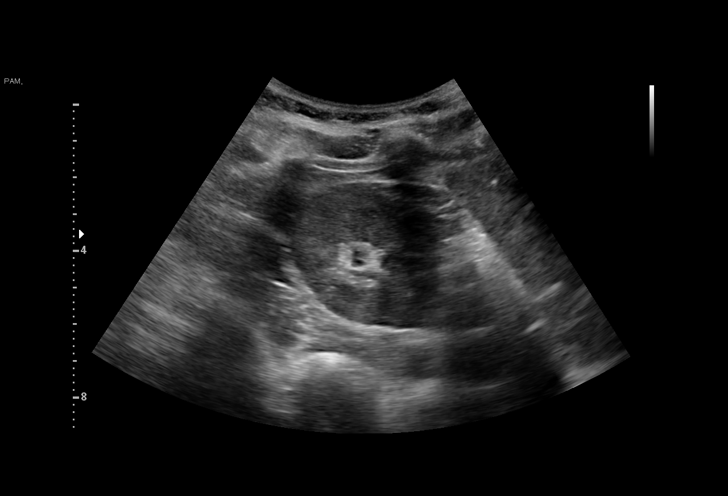
[im 24/48]
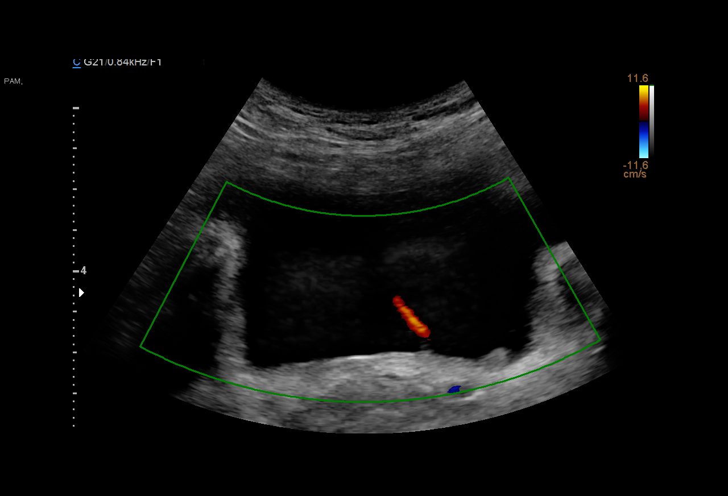
[im 28/48]
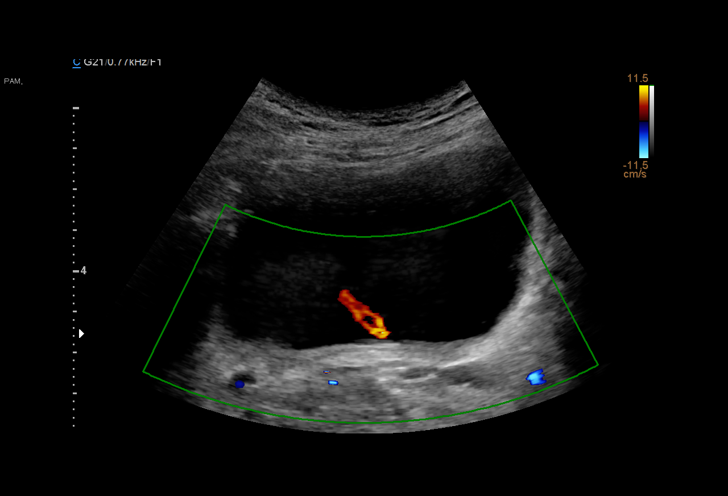
[im 30/48]
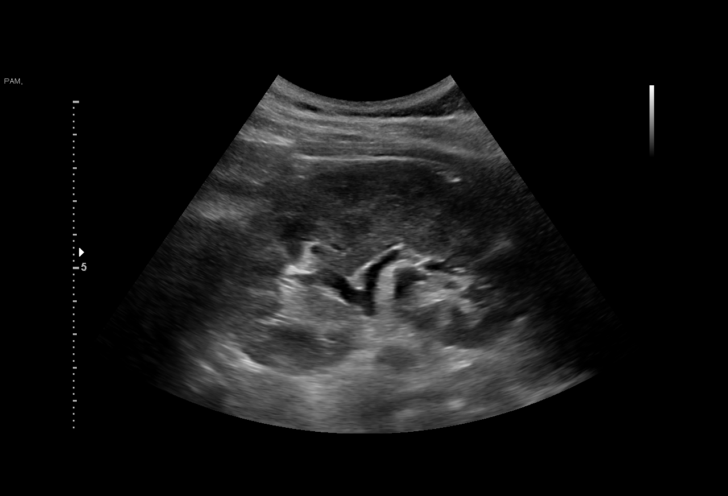
[im 34/48]
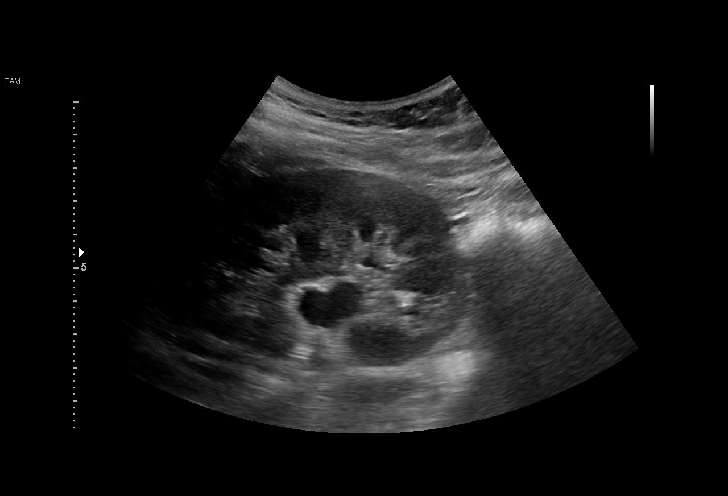
[im 38/48]
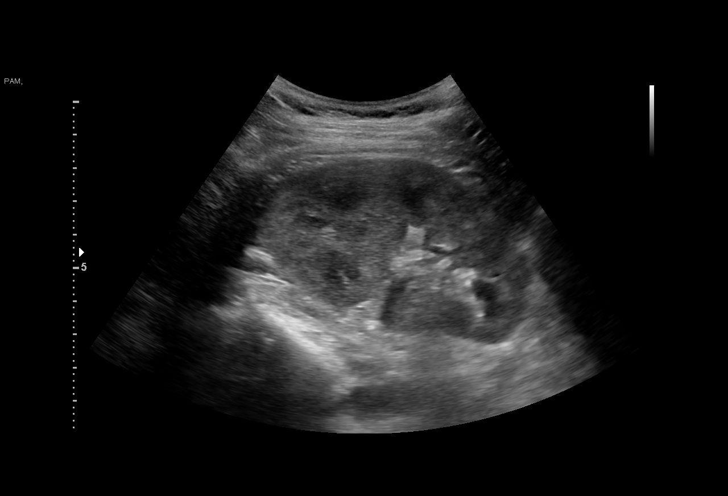
[im 40/48]
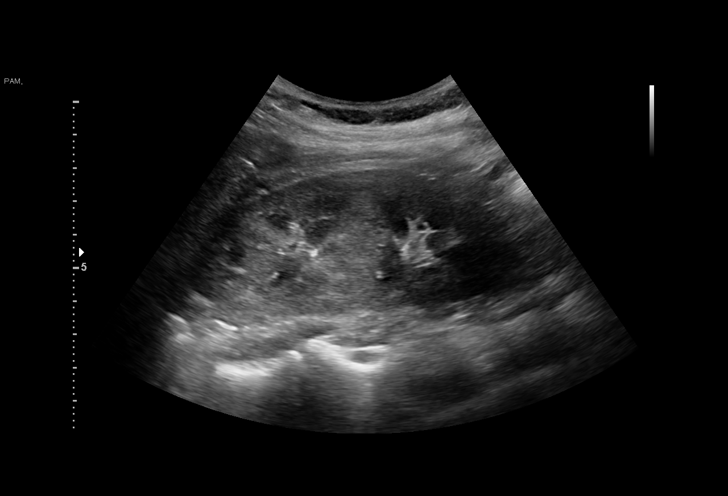
[im 44/48]
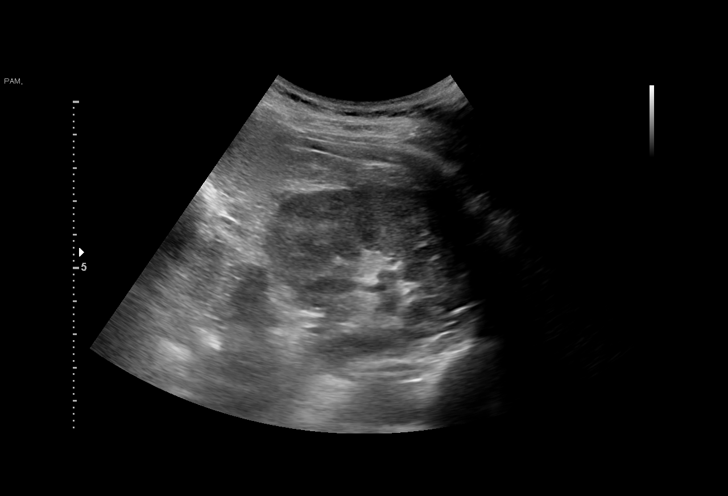
[im 48/48]
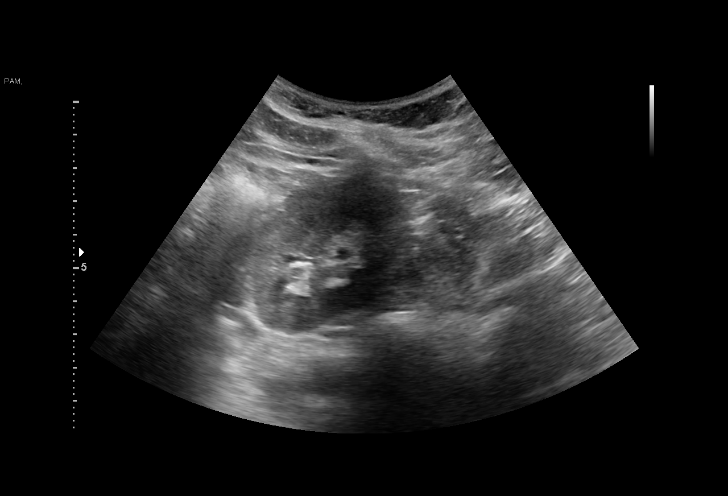

[15 of 25 positions shown; findings below may reference images not displayed]

FINDINGS: Right Kidney:

Renal measurements: 11.7 x 5.7 x 5.3 cm = volume: 356 mL. Moderate
hydronephrosis. Echogenicity within normal limits. No mass
visualized.

Left Kidney:

Renal measurements: 9.8 x 5.1 x 5.8 cm = volume: 286 mL.
Echogenicity within normal limits. Mild hydronephrosis. No
hydronephrosis visualized.

Bladder:

Appears normal for degree of bladder distention.
IMPRESSION: Moderate right and mild left hydronephrosis. The exam is otherwise
negative.

## 2021-01-25 IMAGING — US US MFM OB DETAIL+14 WK
1 series · 12 of 28 positions shown · non-contrast
Comparison: none

[Series 1: us mfm ob detail+14 wk · 12 of 100 slices shown]
[im 4/100]
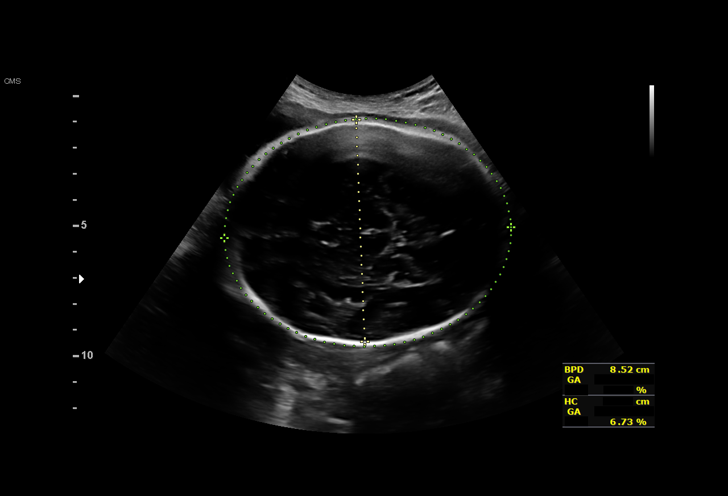
[im 12/100]
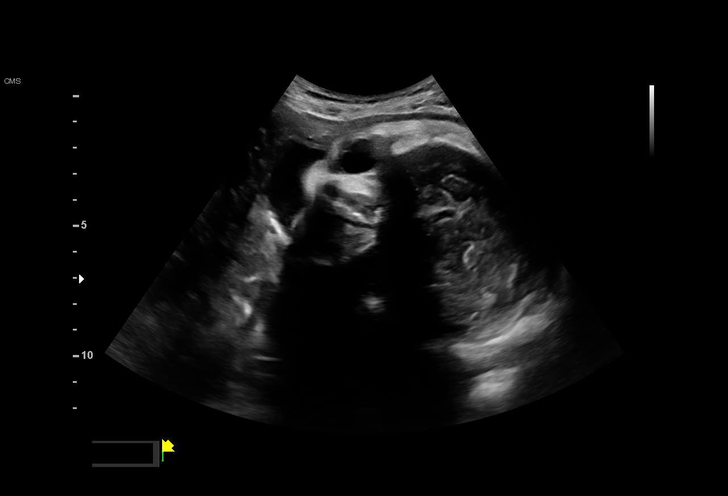
[im 19/100]
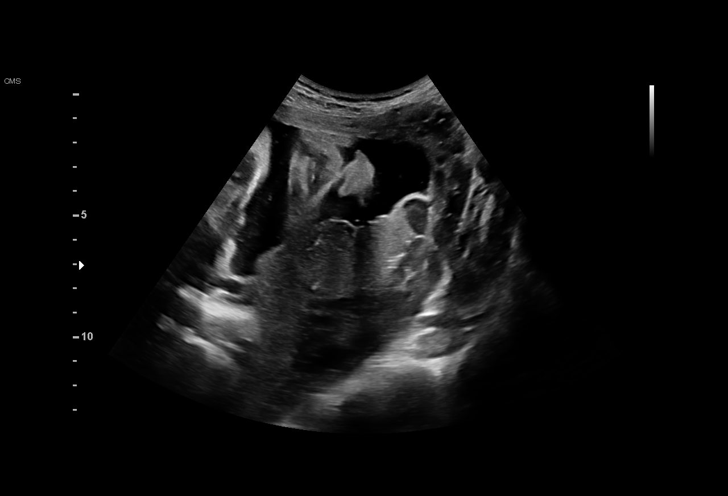
[im 30/100]
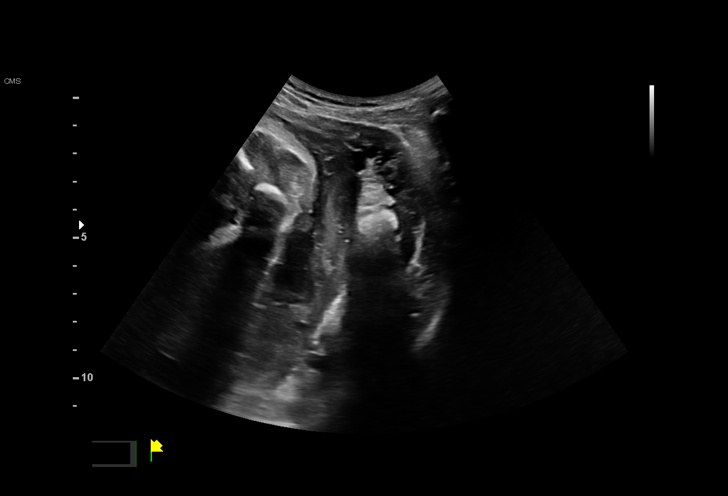
[im 37/100]
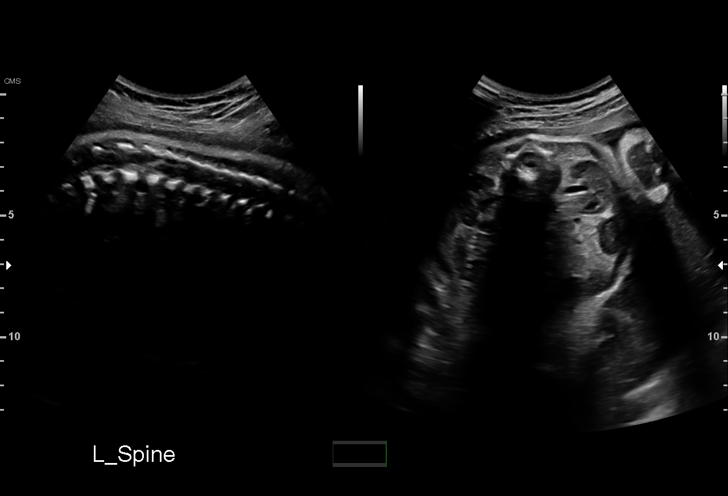
[im 45/100]
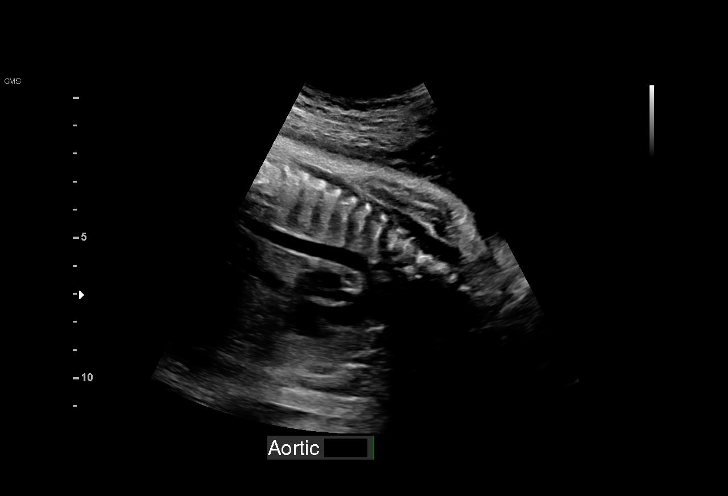
[im 56/100]
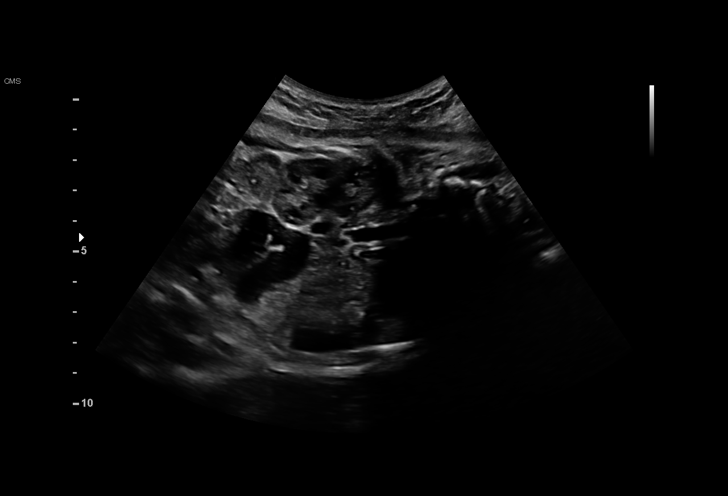
[im 63/100]
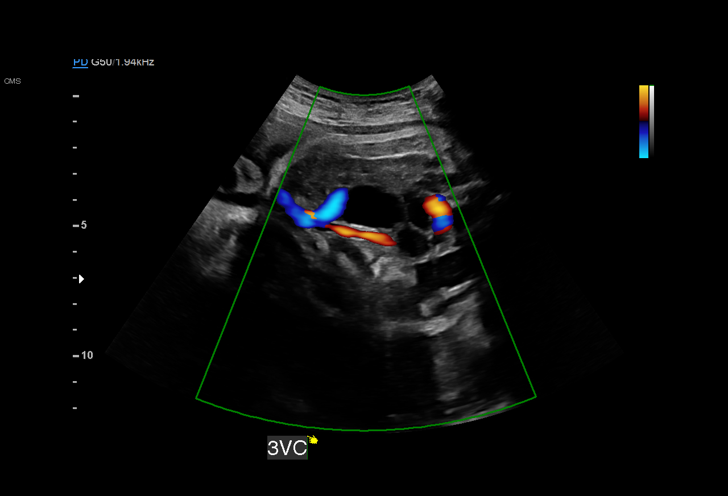
[im 70/100]
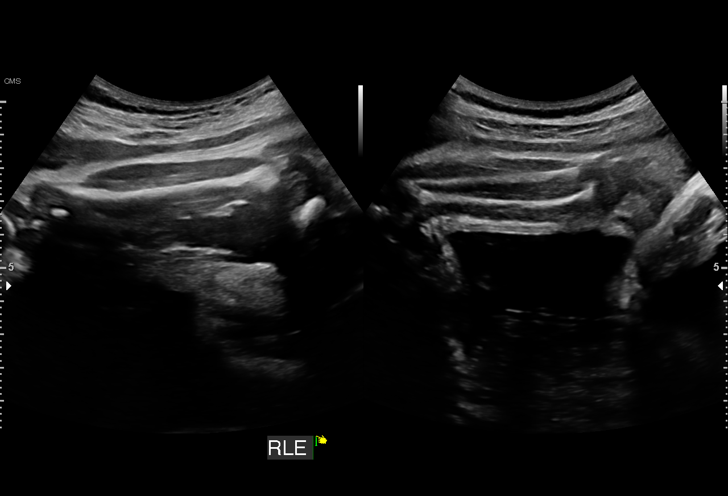
[im 81/100]
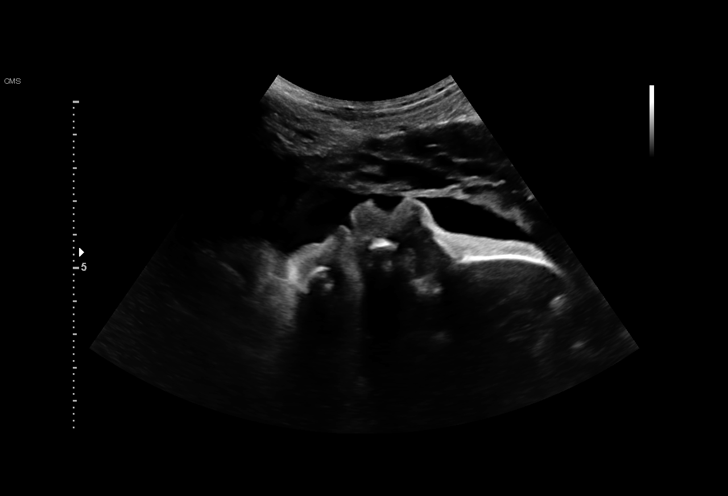
[im 89/100]
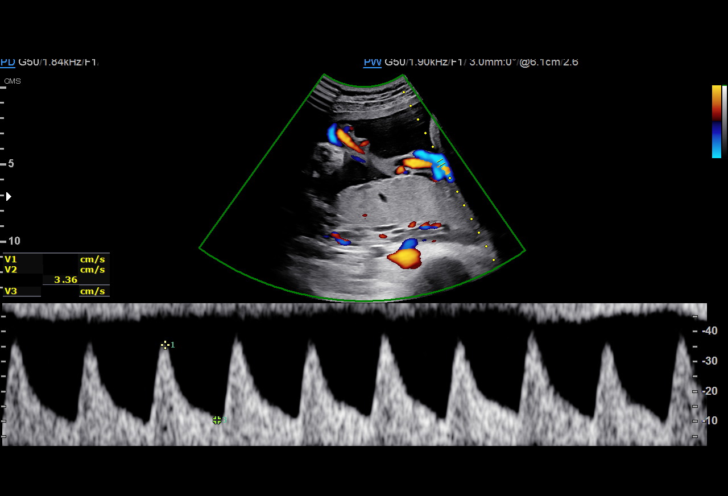
[im 96/100]
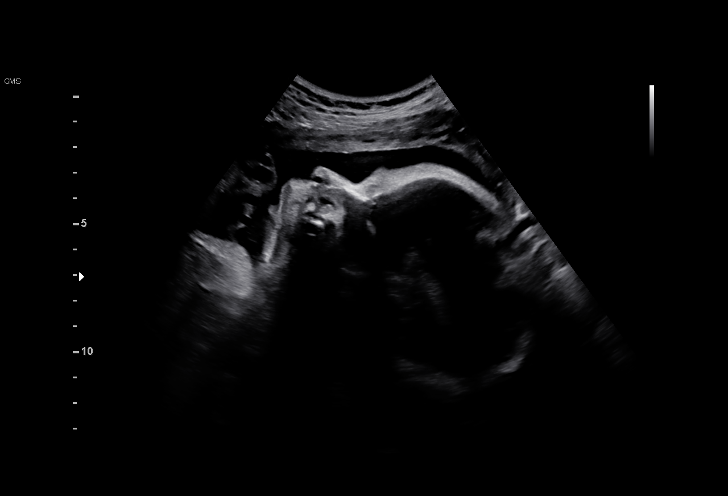

[12 of 28 positions shown; findings below may reference images not displayed]

[REDACTED]. [HOSPITAL]
                   DO

  2  US MFM UA CORD DOPPLER               76820.02     ARACELLY WEISS
     COX BARZOLA/NONSTRESS
 ----------------------------------------------------------------------

 ----------------------------------------------------------------------
Indications

  Maternal care for known or suspected poor
  fetal growth, third trimester, not applicable or
  unspecified IUGR
  Poor obstetric history: Previous IUFD
  (stillbirth at 36 weeks)
  Antenatal screening for malformations
  Medical complication of pregnancy (POTS)
  35 weeks gestation of pregnancy
 ----------------------------------------------------------------------
Fetal Evaluation

 Num Of Fetuses:         1
 Fetal Heart Rate(bpm):  149
 Cardiac Activity:       Observed
 Presentation:           Cephalic
 Placenta:               Posterior
 P. Cord Insertion:      Not well visualized

 Amniotic Fluid
 AFI FV:      Within normal limits

 AFI Sum(cm)     %Tile       Largest Pocket(cm)
 15.53           57

 RUQ(cm)       RLQ(cm)       LUQ(cm)        LLQ(cm)

Biophysical Evaluation
 Amniotic F.V:   Within normal limits       F. Tone:        Observed
 F. Movement:    Observed                   Score:          [DATE]
 F. Breathing:   Observed
Biometry

 BPD:      85.1  mm     G. Age:  34w 2d         16  %    CI:        73.45   %    70 - 86
                                                         FL/HC:      19.1   %    20.1 -
 HC:      315.5  mm     G. Age:  35w 3d         12  %    HC/AC:      1.12        0.93 -
 AC:      281.8  mm     G. Age:  32w 2d        < 1  %    FL/BPD:     71.0   %    71 - 87
 FL:       60.4  mm     G. Age:  31w 3d        < 1  %    FL/AC:      21.4   %    20 - 24
 HUM:      53.5  mm     G. Age:  31w 1d        < 5  %

 Est. FW:    1011  gm      4 lb 6 oz      1  %
OB History

 Gravidity:    2         Term:   0        Prem:   1        SAB:   0
 TOP:          0       Ectopic:  0        Living: 0
Gestational Age

 LMP:           39w 0d        Date:  08/23/18                 EDD:   05/30/19
 U/S Today:     33w 3d                                        EDD:   07/08/19
 Best:          35w 6d     Det. By:  Early Ultrasound         EDD:   06/21/19
Anatomy

 Cranium:               Appears normal         Aortic Arch:            Appears normal
 Cavum:                 Appears normal         Ductal Arch:            Not well visualized
 Ventricles:            Appears normal         Diaphragm:              Appears normal
 Choroid Plexus:        Appears normal         Stomach:                Appears normal, left
                                                                       sided
 Cerebellum:            Appears normal         Abdomen:                Appears normal
 Posterior Fossa:       Appears normal         Abdominal Wall:         Not well visualized
 Nuchal Fold:           Not applicable (>20    Cord Vessels:           Appears normal (3
                        wks GA)                                        vessel cord)
 Face:                  Appears normal         Kidneys:                Appear normal
                        (orbits and profile)
 Lips:                  Appears normal         Bladder:                Appears normal
 Thoracic:              Appears normal         Spine:                  Limited views
                                                                       appear normal
 Heart:                 Appears normal         Upper Extremities:      Visualized
                        (4CH, axis, and
                        situs)
 RVOT:                  Appears normal         Lower Extremities:      Visualized
 LVOT:                  Not well visualized
Doppler - Fetal Vessels

 Umbilical Artery
  S/D     %tile                                            ADFV    RDFV
 3.48       94                                                No      No

Cervix Uterus Adnexa

 Cervix
 Not visualized (advanced GA >36wks)
 Uterus
 No abnormality visualized.

 Left Ovary
 Within normal limits.

 Right Ovary
 Within normal limits.

 Cul De Sac
 No free fluid seen.

 Adnexa
 No abnormality visualized.
Comments

 This patient was seen in consultation due to fetal growth
 restriction with a prior IUFD.  The patient reports that a fetal
 demise was found at 36 weeks and 5 days of her last
 pregnancy.  She reports that her prior pregnancy was
 essentially uncomplicated up until that point.  The birth weight
 of the demised fetus was 4 pounds 11 ounces.  Due to her
 history of a prior IUFD, the patient has been followed with
 serial growth ultrasounds in your office.  Fetal growth
 restriction was noted on a recent ultrasound exam.  She
 reports a history of POTS that is currently treated with
 atenolol 25 mg daily.  The patient denies any history of
 hypertension or diabetes.
 On today's exam, the overall EFW measures 4 pounds 6
 ounces (1st percentile for her gestational age).  There was
 normal amniotic fluid noted.
 A biophysical profile performed today was [DATE].  The
 patient also had a reactive nonstress test today.
 Doppler studies of the umbilical arteries performed due to
 fetal growth restriction showed a slightly elevated S/D ratio of
 3.48.  There were no signs of absent or reversed end-
 diastolic flow.
 Due to the history of a prior IUFD and fetal growth restriction
 noted today, at her current gestational age, I would
 recommend that the patient receive a complete course of
 antenatal corticosteroids followed by delivery after she has
 received the complete course of steroids.

 The patient received the first dose of steroids in our office
 today.  She will go to your office tomorrow morning to receive
 the second dose of steroids.  Another NST should be
 performed in your office tomorrow.
 The patient may then be admitted to the hospital tomorrow
 evening for cervical ripening.

 The patient understands that there is a possibility that her
 baby may require a NICU admission for delivery at her
 current gestational age.  However, she is willing to accept the
 risk of a NICU admission in order to have a good outcome in
 this pregnancy.

 A total of 30 minutes was spent counseling and coordinating
 the care for this patient.  Greater than 50% of the time was
 spent in direct face-to-face contact.
